# Patient Record
Sex: Male | Born: 1935 | Race: White | Hispanic: No | Marital: Married
Health system: Midwestern US, Academic
[De-identification: ages and names within clinical notes are randomized; demographics above are authoritative.]

---

## 2017-02-20 MED ORDER — ISOSORBIDE MONONITRATE 120 MG PO TB24
ORAL_TABLET | Freq: Every morning | ORAL | 1 refills | 90.00000 days | Status: DC
Start: 2017-02-20 — End: 2017-06-27

## 2017-03-19 ENCOUNTER — Encounter: Admit: 2017-03-19 | Discharge: 2017-03-19 | Payer: MEDICARE

## 2017-03-19 MED ORDER — AMLODIPINE 5 MG PO TAB
5 mg | ORAL_TABLET | Freq: Every day | ORAL | 2 refills | Status: AC
Start: 2017-03-19 — End: 2018-01-28

## 2017-05-22 ENCOUNTER — Encounter: Admit: 2017-05-22 | Discharge: 2017-05-22 | Payer: MEDICARE

## 2017-05-22 MED ORDER — ISOSORBIDE MONONITRATE 120 MG PO TB24
ORAL_TABLET | Freq: Every morning | ORAL | 2 refills | 30.00000 days | Status: AC
Start: 2017-05-22 — End: 2017-06-27

## 2017-06-27 ENCOUNTER — Ambulatory Visit: Admit: 2017-06-27 | Discharge: 2017-06-28 | Payer: MEDICARE

## 2017-06-27 ENCOUNTER — Encounter: Admit: 2017-06-27 | Discharge: 2017-06-27 | Payer: MEDICARE

## 2017-06-27 DIAGNOSIS — J449 Chronic obstructive pulmonary disease, unspecified: ICD-10-CM

## 2017-06-27 DIAGNOSIS — I25118 Atherosclerotic heart disease of native coronary artery with other forms of angina pectoris: Principal | ICD-10-CM

## 2017-06-27 DIAGNOSIS — J45909 Unspecified asthma, uncomplicated: ICD-10-CM

## 2017-06-27 DIAGNOSIS — I10 Essential (primary) hypertension: ICD-10-CM

## 2017-06-27 DIAGNOSIS — I252 Old myocardial infarction: ICD-10-CM

## 2017-06-27 DIAGNOSIS — I2510 Atherosclerotic heart disease of native coronary artery without angina pectoris: Principal | ICD-10-CM

## 2017-06-27 DIAGNOSIS — Z951 Presence of aortocoronary bypass graft: ICD-10-CM

## 2017-06-27 DIAGNOSIS — J189 Pneumonia, unspecified organism: ICD-10-CM

## 2017-06-27 DIAGNOSIS — J984 Other disorders of lung: ICD-10-CM

## 2017-06-27 NOTE — Progress Notes
Date of Service: 06/27/2017    Manuel Gould is a 82 y.o. male.       HPI     Thanks so much for letting me see Manuel Gould.  He has noted that since he has been on chemotherapy for a skin lesion that was biopsied on his nose, he has had some orthostatic lightheadedness.  I am going to back off on his Norvasc entirely over the weekend and let his blood pressure rise a bit while he talks with his cancer doctors about his medication.  He is not orthostatic in my office, but he notes if he bends over and stands up too quickly, he gets a bit lightheaded.     We have had recent in the past cardiac catheterizations that were satisfactory with patent grafts and a small circumflex with a 75% stenosis that we are treating medically.  He is on an ACE inhibitor and Imdur.  It may be that his blood pressure is lower on the chemotherapeutic agent, and we need to back off a bit.  Otherwise, he tells me he is completely asymptomatic.    ([id number])             Vitals:    06/27/17 1443 06/27/17 1455   BP: 116/52 122/56   Pulse: 61    Weight: 94.4 kg (208 lb 3.2 oz)    Height: 1.778 m (5' 10)      Body mass index is 29.87 kg/m???.     Past Medical History  Patient Active Problem List    Diagnosis Date Noted   ??? COPD (chronic obstructive pulmonary disease) (HCC)    ??? Ventricular hypertrophy 02/23/2009   ??? History of MI (myocardial infarction) 02/23/2009   ??? Asthma 02/23/2009   ??? 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 CAD (coronary artery disease), native coronary artery      A. June 2003 with LIMA graft to the LAD, a radial artery to the circumflex and a vein graft to the PDA.    B. Previous heart cath at [facility] with unsuccessful attempts to revascularize the artery, but there was no evidence of ischemia on perfusion exam in the right coronary artery territory.    C. Stress study from May 2005 showed an inferolateral perfusion defect that was not present on the more recent study      here in February 2010. D. Stress study from February 2010 which was performed with regadenoson stress with an ejection fraction of 62 percent had normal left ventricular volumes.  The perfusion images did not show ischemia.  There was evidence of some LAD injury, but no high risk indicators were noted.  E. 11/21/15 LHC: Patent grafts.     ??? Essential hypertension          Review of Systems   Constitution: Positive for decreased appetite.   HENT: Positive for tinnitus.    Eyes: Negative.    Cardiovascular: Negative.    Respiratory: Negative.    Endocrine: Negative.    Hematologic/Lymphatic: Negative.    Skin: Positive for skin cancer.   Musculoskeletal: Negative.    Gastrointestinal: Negative.    Genitourinary: Negative.    Neurological: Negative.    Psychiatric/Behavioral: Negative.    Allergic/Immunologic: Negative.        Physical Exam  Examination reveals an 82 year old gentleman in no acute distress.  His head and neck examination is benign.  His lung sounds are clear.  Heart  tones are regular.  He has a patch over a small area of his left lateral nasal crease.  Abdomen is nontender.  He has no peripheral edema, cyanosis, or clubbing.  Neurologically he is grossly intact.  Skin exam is benign.    ([id number])        Cardiovascular Studies  A 12-lead EKG today shows normal sinus rhythm with a right bundle branch block, which is an old finding.    ([id number])        Problems Addressed Today  No diagnosis found.    Assessment and Plan     In summary, this is an 82 year old gentleman with lightheadedness, orthostatic, on his antihypertensive therapy.  I am going to back off slightly while he is on his chemotherapy agent.  He is to call our office on Monday or Tuesday to report on his blood pressure diary.  If he is doing well with this adjustment, I will see him back in 3-6 months.    ([id number])             Current Medications (including today's revisions)  ??? amLODIPine (NORVASC) 5 mg tablet Take 1 tablet by mouth daily. ??? Aspirin 81 mg Tab Take 1 Tab by mouth Daily.   ??? Budesonide (PULMICORT FLEXHALER) 90 mcg/Inhalation AePB Inhale 1 Puff by mouth Twice Daily.   ??? ERIVEDGE 150 mg capsule Take 1 capsule by mouth daily.   ??? isosorbide mononitrate CR (IMDUR) 120 mg tablet TAKE 1 TAB BY MOUTH EVERY MORNING.   ??? metoprolol (LOPRESSOR) 50 mg tablet Take 25 mg by mouth twice daily.   ??? Ramipril (ALTACE) 5 mg Tab Take 1 Tab by mouth Daily.   ??? tamsulosin (FLOMAX) 0.4 mg capsule Take 0.4 mg by mouth Daily.

## 2017-07-09 ENCOUNTER — Encounter: Admit: 2017-07-09 | Discharge: 2017-07-09 | Payer: MEDICARE

## 2017-12-26 LAB — COMPREHENSIVE METABOLIC PANEL
Lab: 0.7 — ABNORMAL HIGH (ref 83–110)
Lab: 0.9
Lab: 101
Lab: 101
Lab: 101
Lab: 13
Lab: 133 — ABNORMAL HIGH (ref 83–110)
Lab: 139 — ABNORMAL LOW (ref 14.0–18.0)
Lab: 17
Lab: 29
Lab: 29 — ABNORMAL HIGH (ref 27.0–31.0)
Lab: 3.3 — ABNORMAL LOW (ref 3.4–4.8)
Lab: 3.5 — ABNORMAL LOW (ref 42.0–52.0)
Lab: 6.9
Lab: 9.5

## 2017-12-27 ENCOUNTER — Ambulatory Visit: Admit: 2017-12-27 | Discharge: 2017-12-28 | Payer: MEDICARE

## 2017-12-27 ENCOUNTER — Encounter: Admit: 2017-12-27 | Discharge: 2017-12-27 | Payer: MEDICARE

## 2017-12-27 DIAGNOSIS — R0602 Shortness of breath: Principal | ICD-10-CM

## 2017-12-28 LAB — CBC: Lab: 14 — ABNORMAL HIGH (ref 4.8–10.8)

## 2017-12-28 LAB — BASIC METABOLIC PANEL: Lab: 140 — ABNORMAL LOW (ref 4.70–6.10)

## 2018-01-14 ENCOUNTER — Ambulatory Visit: Admit: 2018-01-14 | Discharge: 2018-01-15 | Payer: MEDICARE

## 2018-01-14 ENCOUNTER — Encounter: Admit: 2018-01-14 | Discharge: 2018-01-14 | Payer: MEDICARE

## 2018-01-14 DIAGNOSIS — I25118 Atherosclerotic heart disease of native coronary artery with other forms of angina pectoris: Principal | ICD-10-CM

## 2018-01-14 DIAGNOSIS — J45909 Unspecified asthma, uncomplicated: ICD-10-CM

## 2018-01-14 DIAGNOSIS — Z951 Presence of aortocoronary bypass graft: ICD-10-CM

## 2018-01-14 DIAGNOSIS — J984 Other disorders of lung: ICD-10-CM

## 2018-01-14 DIAGNOSIS — J189 Pneumonia, unspecified organism: ICD-10-CM

## 2018-01-14 DIAGNOSIS — I252 Old myocardial infarction: ICD-10-CM

## 2018-01-14 DIAGNOSIS — I10 Essential (primary) hypertension: ICD-10-CM

## 2018-01-14 DIAGNOSIS — J449 Chronic obstructive pulmonary disease, unspecified: ICD-10-CM

## 2018-01-14 DIAGNOSIS — I2510 Atherosclerotic heart disease of native coronary artery without angina pectoris: Principal | ICD-10-CM

## 2018-01-16 ENCOUNTER — Encounter: Admit: 2018-01-16 | Discharge: 2018-01-16 | Payer: MEDICARE

## 2018-01-16 DIAGNOSIS — J45909 Unspecified asthma, uncomplicated: ICD-10-CM

## 2018-01-16 DIAGNOSIS — J449 Chronic obstructive pulmonary disease, unspecified: ICD-10-CM

## 2018-01-16 DIAGNOSIS — J189 Pneumonia, unspecified organism: ICD-10-CM

## 2018-01-16 DIAGNOSIS — I252 Old myocardial infarction: ICD-10-CM

## 2018-01-16 DIAGNOSIS — I10 Essential (primary) hypertension: Secondary | ICD-10-CM

## 2018-01-16 DIAGNOSIS — Z951 Presence of aortocoronary bypass graft: ICD-10-CM

## 2018-01-16 DIAGNOSIS — I2510 Atherosclerotic heart disease of native coronary artery without angina pectoris: Principal | ICD-10-CM

## 2018-01-16 DIAGNOSIS — J984 Other disorders of lung: ICD-10-CM

## 2018-01-28 ENCOUNTER — Encounter: Admit: 2018-01-28 | Discharge: 2018-01-28 | Payer: MEDICARE

## 2018-04-01 LAB — BASIC METABOLIC PANEL
Lab: 14
Lab: 140

## 2018-04-01 LAB — LIPID PROFILE
Lab: 107 — ABNORMAL HIGH (ref <100)
Lab: 15 — ABNORMAL HIGH (ref 83–110)
Lab: 163
Lab: 4

## 2018-04-01 LAB — HEMOGLOBIN A1C: Lab: 5.5

## 2018-04-01 LAB — THYROID STIMULATING HORMONE-TSH: Lab: 1.1 — ABNORMAL HIGH (ref 27.0–31.0)

## 2018-04-01 LAB — PROSTATIC SPECIFIC ANTIGEN-PSA: Lab: 0.8 — ABNORMAL HIGH (ref 80.0–99.0)

## 2018-04-01 LAB — CBC: Lab: 7.4

## 2018-07-01 ENCOUNTER — Encounter: Admit: 2018-07-01 | Discharge: 2018-07-01 | Payer: MEDICARE

## 2018-07-01 MED ORDER — ISOSORBIDE MONONITRATE 120 MG PO TB24
120 mg | ORAL_TABLET | Freq: Every morning | ORAL | 2 refills | 30.00000 days | Status: AC
Start: 2018-07-01 — End: 2019-05-05

## 2018-12-10 LAB — BASIC METABOLIC PANEL
Lab: 0.9
Lab: 10
Lab: 102
Lab: 13
Lab: 138
Lab: 161 — ABNORMAL HIGH (ref 70–105)
Lab: 28
Lab: 4.5
Lab: 82
Lab: 9.9

## 2018-12-31 ENCOUNTER — Encounter: Admit: 2018-12-31 | Discharge: 2018-12-31 | Payer: MEDICARE

## 2019-01-01 ENCOUNTER — Encounter: Admit: 2019-01-01 | Discharge: 2019-01-01 | Payer: MEDICARE

## 2019-01-01 ENCOUNTER — Ambulatory Visit: Admit: 2019-01-01 | Discharge: 2019-01-02 | Payer: MEDICARE

## 2019-01-01 DIAGNOSIS — J984 Other disorders of lung: ICD-10-CM

## 2019-01-01 DIAGNOSIS — I10 Essential (primary) hypertension: ICD-10-CM

## 2019-01-01 DIAGNOSIS — I2510 Atherosclerotic heart disease of native coronary artery without angina pectoris: Principal | ICD-10-CM

## 2019-01-01 DIAGNOSIS — I252 Old myocardial infarction: ICD-10-CM

## 2019-01-01 DIAGNOSIS — Z951 Presence of aortocoronary bypass graft: ICD-10-CM

## 2019-01-01 DIAGNOSIS — J45909 Unspecified asthma, uncomplicated: ICD-10-CM

## 2019-01-01 DIAGNOSIS — J189 Pneumonia, unspecified organism: ICD-10-CM

## 2019-01-01 DIAGNOSIS — I25118 Atherosclerotic heart disease of native coronary artery with other forms of angina pectoris: Principal | ICD-10-CM

## 2019-01-01 DIAGNOSIS — J449 Chronic obstructive pulmonary disease, unspecified: ICD-10-CM

## 2019-01-01 NOTE — Progress Notes
recently.  He does not really know why.  I am still quite concerned by what sounds like classic angina.  He has had previous bypass surgery.  I am going to set him up for a regadenoson myocardial perfusion study as soon as feasible.    ([id number])             Current Medications (including today's revisions)  ??? Aspirin 81 mg Tab Take 1 Tab by mouth Daily.   ??? budesonide respule (PULMICORT) 0.5 mg/2 mL nebulizer solution Inhale 2 mL solution by nebulizer as directed twice daily.   ??? ERIVEDGE 150 mg capsule Take 1 capsule by mouth daily.   ??? finasteride (PROSCAR) 5 mg tablet Take 5 mg by mouth daily.   ??? isosorbide mononitrate CR (IMDUR) 120 mg tablet Take one tablet by mouth every morning.   ??? metoprolol (LOPRESSOR) 50 mg tablet Take 25 mg by mouth twice daily.

## 2019-01-08 ENCOUNTER — Encounter: Admit: 2019-01-08 | Discharge: 2019-01-08 | Payer: MEDICARE

## 2019-01-08 ENCOUNTER — Ambulatory Visit: Admit: 2019-01-08 | Discharge: 2019-01-09 | Payer: MEDICARE

## 2019-01-08 DIAGNOSIS — I25118 Atherosclerotic heart disease of native coronary artery with other forms of angina pectoris: Principal | ICD-10-CM

## 2019-01-12 ENCOUNTER — Encounter: Admit: 2019-01-12 | Discharge: 2019-01-12 | Payer: MEDICARE

## 2019-01-22 ENCOUNTER — Encounter: Admit: 2019-01-22 | Discharge: 2019-01-22 | Payer: MEDICARE

## 2019-01-22 ENCOUNTER — Encounter: Admit: 2019-01-22 | Discharge: 2019-01-23 | Payer: MEDICARE

## 2019-01-23 ENCOUNTER — Inpatient Hospital Stay
Admit: 2019-01-23 | Discharge: 2019-01-25 | Disposition: A | Discharge status: Home / Self Care | Payer: MEDICARE | Source: Other Acute Inpatient Hospital | Attending: Cardiovascular Disease | Admitting: Cardiovascular Disease

## 2019-01-23 ENCOUNTER — Encounter: Admit: 2019-01-23 | Discharge: 2019-01-23 | Payer: MEDICARE

## 2019-01-23 LAB — BASIC METABOLIC PANEL
Lab: 0.7 mg/dL (ref 0.4–1.24)
Lab: 13 mg/dL (ref 7–25)
Lab: 29 MMOL/L (ref 21–30)
Lab: >60 mL/min (ref >60)

## 2019-01-23 LAB — TROPONIN-I
Lab: 1.1 ng/mL — ABNORMAL HIGH (ref 0.0–0.05)
Lab: 1.2 ng/mL — ABNORMAL HIGH (ref 0.0–0.05)

## 2019-01-23 LAB — CBC AND DIFF
Lab: 0 10*3/uL (ref 0–0.45)
Lab: 0.1 10*3/uL (ref 0–0.20)
Lab: 10 10*3/uL (ref 4.5–11.0)

## 2019-01-23 LAB — MAGNESIUM: Lab: 2 mg/dL (ref 1.6–2.6)

## 2019-01-23 LAB — POTASSIUM: Lab: 5.1 MMOL/L (ref 3.5–5.1)

## 2019-01-23 LAB — PTT (APTT)
Lab: 36 s (ref 24.0–36.5)
Lab: 54 s — ABNORMAL HIGH (ref 24.0–36.5)

## 2019-01-23 LAB — BNP (B-TYPE NATRIURETIC PEPTI): Lab: 277 pg/mL — ABNORMAL HIGH (ref 0–100)

## 2019-01-23 MED ORDER — HEPARIN (PORCINE) IN 5 % DEX 20,000 UNIT/500 ML (40 UNIT/ML) IV SOLP
0-2000 [IU]/h | INTRAVENOUS | 0 refills | Status: DC
Start: 2019-01-23 — End: 2019-01-24
  Administered 2019-01-23: 08:00:00 1000 [IU]/h via INTRAVENOUS

## 2019-01-23 MED ORDER — BUDESONIDE 0.5 MG/2 ML IN NBSP
0.5 mg | Freq: Two times a day (BID) | RESPIRATORY_TRACT | 0 refills | Status: DC
Start: 2019-01-23 — End: 2019-01-25
  Administered 2019-01-23 – 2019-01-25 (×4): 0.5 mg via RESPIRATORY_TRACT

## 2019-01-23 MED ORDER — ASPIRIN 325 MG PO TAB
325 mg | Freq: Once | ORAL | 0 refills | Status: CP
Start: 2019-01-23 — End: ?
  Administered 2019-01-23: 07:00:00 325 mg via ORAL

## 2019-01-23 MED ORDER — SODIUM CHLORIDE 0.9 % IV SOLP
500 mL | Freq: Once | INTRAVENOUS | 0 refills | Status: CP | PRN
Start: 2019-01-23 — End: ?
  Administered 2019-01-23: 15:00:00 500 mL via INTRAVENOUS

## 2019-01-23 MED ORDER — ASPIRIN 81 MG PO TBEC
81 mg | Freq: Every day | ORAL | 0 refills | Status: DC
Start: 2019-01-23 — End: 2019-01-25
  Administered 2019-01-24 – 2019-01-25 (×2): 81 mg via ORAL

## 2019-01-23 MED ORDER — ALBUTEROL SULFATE 90 MCG/ACTUATION IN HFAA
2 | RESPIRATORY_TRACT | 0 refills | Status: DC | PRN
Start: 2019-01-23 — End: 2019-01-25

## 2019-01-23 MED ORDER — METOPROLOL TARTRATE 25 MG PO TAB
25 mg | Freq: Once | ORAL | 0 refills | Status: CP
Start: 2019-01-23 — End: ?
  Administered 2019-01-23: 23:00:00 25 mg via ORAL

## 2019-01-23 MED ORDER — HEPARIN (PORCINE) BOLUS FOR CONTINUOUS INF (BAG)
20-40 [IU]/kg | INTRAVENOUS | 0 refills | Status: DC
Start: 2019-01-23 — End: 2019-01-24

## 2019-01-23 MED ORDER — ISOSORBIDE MONONITRATE 60 MG PO TB24
120 mg | Freq: Every day | ORAL | 0 refills | Status: DC
Start: 2019-01-23 — End: 2019-01-25
  Administered 2019-01-23 – 2019-01-25 (×3): 120 mg via ORAL

## 2019-01-23 MED ORDER — TICAGRELOR 90 MG PO TAB
90 mg | Freq: Two times a day (BID) | ORAL | 0 refills | Status: AC
Start: 2019-01-23 — End: ?
  Administered 2019-01-24 – 2019-01-25 (×2): 90 mg via ORAL

## 2019-01-23 MED ORDER — DIPHENHYDRAMINE HCL 25 MG PO CAP
25 mg | ORAL | 0 refills | Status: DC | PRN
Start: 2019-01-23 — End: 2019-01-25

## 2019-01-23 MED ORDER — POTASSIUM CHLORIDE 20 MEQ PO TBTQ
40 meq | Freq: Once | ORAL | 0 refills | Status: CP
Start: 2019-01-23 — End: ?
  Administered 2019-01-23: 10:00:00 40 meq via ORAL

## 2019-01-23 MED ORDER — FINASTERIDE 5 MG PO TAB
5 mg | Freq: Every day | ORAL | 0 refills | Status: DC
Start: 2019-01-23 — End: 2019-01-25
  Administered 2019-01-24 – 2019-01-25 (×2): 5 mg via ORAL

## 2019-01-23 MED ORDER — METOPROLOL TARTRATE 25 MG PO TAB
25 mg | Freq: Two times a day (BID) | ORAL | 0 refills | Status: DC
Start: 2019-01-23 — End: 2019-01-25
  Administered 2019-01-24 – 2019-01-25 (×3): 25 mg via ORAL

## 2019-01-23 MED ORDER — PERFLUTREN LIPID MICROSPHERES 1.1 MG/ML IV SUSP
1-20 mL | Freq: Once | INTRAVENOUS | 0 refills | Status: CP | PRN
Start: 2019-01-23 — End: ?
  Administered 2019-01-23: 22:00:00 6 mL via INTRAVENOUS

## 2019-01-23 MED ORDER — ISOSORBIDE MONONITRATE 60 MG PO TB24
120 mg | Freq: Every day | ORAL | 0 refills | Status: DC
Start: 2019-01-23 — End: 2019-01-23

## 2019-01-23 MED ORDER — DIPHENHYDRAMINE HCL 50 MG/ML IJ SOLN
25 mg | INTRAVENOUS | 0 refills | Status: DC | PRN
Start: 2019-01-23 — End: 2019-01-25

## 2019-01-23 NOTE — H&P (View-Only)
Chief Complaint:  Chest pain  History of Present Illness: Manuel Gould is an 84 y/o male with past medical history significant for: COPD, CAD s/p CABG 2003 and HTN who was transferred to [facility] for management of NSTEMI    Patient has been following with Dr. Lyda Kalata for typical anginal chest pain for the preceding 2 weeks.  His typical pain is described as substernal burning pain with radiation down his left arm is relieved with rest and exacerbated by exercise.  Due to the nature of the pain Dr. Lawerance Sabal had proceeded with stress testing which was minimally abnormal.  Since the stress test he has continued to have similar pain episodes.  Tonight, he developed the same chest pain while sitting watching TV without any exertion.  The chest pain radiated down his left arm and pain was rated a 4 out of 10 in severity.  Due to the pain occurring at rest which is different from his typical anginal pain, he presented to the ER.  By time of arrival to ER his chest pain is subsided.  At outside hospital ER, troponin was noted to be elevated to 1.6.  Patient was given aspirin and heparin bolus before transfer to [facility].    On arrival, patient voices no chest pain.  He denies any shortness of breath or palpitations.     Medical History:   Diagnosis Date   ??? Asthma    ??? CAD (coronary artery disease)    ??? CAD (coronary artery disease), native coronary artery     A. June 2003 with LIMA graft to the LAD, a radial artery to the circumflex and a vein graft to the PDA.   B. Previous heart cath at [facility] with unsuccessful attempts to revascularize the artery, but there was no evidence of ischemia on perfusion exam in the right coronary artery territory.   C. Stress study from May 2005 showed an inferolateral perfusion defect that was not present on    ??? Chronic lung disease 02/16/2010   ??? COPD (chronic obstructive pulmonary disease) (HCC)    ??? Essential hypertension ??? History of MI (myocardial infarction) 02/23/2009   ??? HTN (hypertension)    ??? Pneumonia 04/2009    hospitalized   ??? 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 HX CORONARY ARTERY BYPASS GRAFT  2003    x3 vessels   ??? Left Heart Catheterization With Ventriculogram Left 11/21/2015    Performed by Greig Castilla[facility] at [facility] CATH LAB   ??? Coronary Angiography N/A 11/21/2015    Performed by Greig Castilla[facility] at [facility] CATH LAB   ??? Possible Percutaneous Coronary Intervention N/A 11/21/2015    Performed by Greig Castilla, MD at [facility] CATH LAB     Family history reviewed; non-contributory  Social History     Socioeconomic History   ??? Marital status: Married     Spouse name: Not on file   ??? Number of children: Not on file   ??? Years of education: Not on file   ??? Highest education level: Not on file   Occupational History   ??? Not on file   Social Needs   ??? Financial resource strain: Not on file   ??? Food insecurity     Worry: Not on file     Inability: Not on file   ??? Transportation needs     Medical:  Not on file     Non-medical: Not on file   Tobacco Use   ??? Smoking status: Former Smoker     Last attempt to quit: 10/15/1958     Years since quitting: 60.3   ??? Smokeless tobacco: Never Used   Substance and Sexual Activity   ??? Alcohol use: No   ??? Drug use: No   ??? Sexual activity: Not on file   Lifestyle   ??? Physical activity     Days per week: Not on file     Minutes per session: Not on file   ??? Stress: Not on file   Relationships   ??? Social Wellsite geologist on phone: Not on file     Gets together: Not on file     Attends religious service: Not on file     Active member of club or organization: Not on file     Attends meetings of clubs or organizations: Not on file     Relationship status: Not on file   ??? Intimate partner violence     Fear of current or ex partner: Not on file     Emotionally abused: Not on file     Physically abused: Not on file     Forced sexual activity: Not on file GI: Soft, non-distended. No tenderness to palpation. Bowel sounds active   Ext: no lower extremity edema. Left Radial pulse absent  Psych: Appropriate mood and affect.      Lab/Radiology/Other Diagnostic Tests:  24-hour labs:  No results found for this visit on 01/23/19 (from the past 24 hour(s)).     Pertinent radiology reviewed.    Ann Maki, MD  Pager [phone]

## 2019-01-23 NOTE — Case Management (ED)
Case Management Admission Assessment    NAME:Manuel Gould                          MRN: [id number]             DOB:March 23, 1935          AGE: 84 y.o.  ADMISSION DATE: 01/23/2019             DAYS ADMITTED: LOS: 0 days      Today???s Date: 01/23/2019    Source of Information: Pt's spouse       Plan  ??? Plan: Case Management Assessment, Discharge Planning for Home Anticipated, Assist PRN with SW/NCM Services  ??? NCM spoke with pt's spouse via telephone due to COVID-19 precautions in continuation of care and dc planning. NCM introduced self and NCM/SW roles. Provided contact information and encouraged pt to reach out to [facility] team with questions or concerns.   ??? Pt's Cardiologist is Dr. Geronimo Boot w [facility]  ??? Pt admitted for further eval/treat of NSTEMI.  At this time, anticipate pt will dc home w family assist,  ??? CM team to continue to follow for further dc planning/case progression.     Patient Address/Phone  [address]  [place] [address]  [phone] (home)     Emergency Contact  Extended Emergency Contact Information  Primary Emergency Contact: Nooney,Carol  Address: [address]           [place] [address]  Home Phone: [phone]  Mobile Phone: [phone]  Relation: Spouse  Secondary Emergency Contact: Wobletz,[name]  Address: [address]           [place] [address] [place]  Home Phone: [phone]  Relation: Daughter    Healthcare Directive         Transportation  Does the patient need discharge transport arranged?: No  Transportation Name, Phone and Availability #1: [name], pt's dgt  Does the patient use Medicaid Transportation?: No    Expected Discharge Date  Expected Discharge Date: 01/25/19  Expected Discharge Time: 1200    Living Situation Prior to Admission  ? Living Arrangements  Type of Residence: Home, independent  Living Arrangements: Spouse/significant other  Financial risk analyst / Tub: Tub/Shower Unit  How many levels in the residence?: 2  Can patient live on one level if needed?: Yes Does residence have entry and/or side stairs?: Yes(4- managed steps well pta)  Assistance needed prior to admit or anticipated on discharge: Yes  Who provides assistance or could if needed?: Spouse is herself receovering from surgery but still independent and able to assist pt w non-physical needs. They also have their dgt [name] who lives close and can assist as well.  Are they in good health?: Yes  Can support system provide 24/7 care if needed?: Maybe  ? Level of Function   Prior level of function: Independent  ? Cognitive Abilities   Cognitive Abilities: (Assessment completed w spouse.)    Financial Resources  ? Coverage  Primary Insurance: Medicare(A/B)  Secondary Insurance: Best boy)  Additional Coverage: (S) RX(PDP- BCBS- meds affordable thus far)    ? Source of Income   Source Of Income: Other retirement income  ? Financial Assistance Needed?  NA    Psychosocial Needs  ? Mental Health  Mental Health History: No  ? Substance Use History  Substance Use History Screen: No  ? Other  NA    Current/Previous Services  ? PCP  [place],  Jonny Ruiz, [phone], [phone]  ? Pharmacy    [facility] - [place], [place] - [address]  [address] [place] [address]  Phone: [phone] Fax: [phone]    [id number] - [place], [place] - [address]  [address] [place]  [place] [address]  Phone: [phone] Fax: [phone]    ? Durable Medical Equipment   Durable Medical Equipment at home: Nebulizer(uses twice a day)  ? Home Health  Receiving home health: No  ? Hemodialysis or Peritoneal Dialysis  Undergoing hemodialysis or peritoneal dialysis: No  ? Tube/Enteral Feeds     ? Infusion  Receive infusions: No  ? Private Duty  Private duty help used: No  ? Home and Community Based Services  Home and community based services: No  ? [employer]: N/A  ? Hospice  Hospice: No  ? Outpatient Therapy  PT: No  OT: No  SLP: No  ? Skilled Nursing Facility/Nursing Home

## 2019-01-23 NOTE — Progress Notes
Cardiovascular Inpatient Progress Note    Name:  Manuel Gould   NFAOZ'H Date:  01/23/2019  Admission Date: 01/23/2019  LOS: LOS: 0 days                     Assessment/Plan:    Principal Problem:    NSTEMI (non-ST elevated myocardial infarction) [facility])  Active Problems:    CAD (coronary artery disease), native coronary artery    Essential hypertension    S/P CABG x 3    COPD (chronic obstructive pulmonary disease) (HCC)    History of basal cell carcinoma    BPH (benign prostatic hyperplasia)    Manuel Gould is an 84 y/o male with past medical history significant for: COPD, CAD s/p CABG 2003 and HTN who was transferred to [facility] for management of NSTEMI    NSTEMI  CAD  - 20 minute episode of CP occurring at rest, troponin elevated at OSH ED to 1.68. Heparin bolus and aspirin loaded prior to transfer.  - Previous Hx of CABG 2003: LIMA to LAD, Radial to Circ, Vein graft to PDA  - Last LHC 11/21/15: Patency of grafts  - Reg MPI 01/08/19: abnormal, demonstrating small???sized basal inferior and inferoseptal???perfusion defect. LVEF 60%  - EKG on arrival: no ST abnormality, persistent RBBB present on prior EKGs noted  - Overall, patient with significant concern for progression of underlying coronary disease given history of progression from Typical Anginal symptoms towards Unstable Angina now with NSTEMI. TIMI score 5.   - Troponin 1.2 -> 1.25  - BNP 277  Plan:  > Continue heparin gtt  > LHC this AM  > n.p.o. at midnight  > Trend troponin q4h  > aspirin 81 mg daily  > Lopressor 25 mg twice daily and Imdur 120 mg daily  > No statin due to history of intolerance  ???  HTN  > Holding ramipril 5 mg for left heart cath  ???  Asthma/COPD  - Patient worked at [employer] with significant environmental exposure  > Pulmicort Neb BID  ???  BPH  - Continue Proscar 5mg  QD  ???  Basal Cell Carcinoma  - Prior surgical removal to nose with reconstructive surgery, healing well w/o evidence of infection.

## 2019-01-24 LAB — LIPID PROFILE
Lab: 114 mg/dL
Lab: 154 mg/dL (ref <200)
Lab: 17 mg/dL
Lab: 87 mg/dL (ref <150)
Lab: 98 mg/dL (ref <100)

## 2019-01-24 LAB — HEMOGLOBIN A1C: Lab: 5.9 % — ABNORMAL HIGH (ref 4.0–6.0)

## 2019-01-24 MED ORDER — CLOPIDOGREL 75 MG PO TAB
75 mg | Freq: Every day | ORAL | 0 refills | Status: DC
Start: 2019-01-24 — End: 2019-01-25

## 2019-01-24 MED ORDER — EZETIMIBE 10 MG PO TAB
10 mg | Freq: Every day | ORAL | 0 refills | Status: DC
Start: 2019-01-24 — End: 2019-01-25
  Administered 2019-01-24 – 2019-01-25 (×2): 10 mg via ORAL

## 2019-01-24 MED ORDER — RAMIPRIL 5 MG PO CAP
5 mg | Freq: Every day | ORAL | 0 refills | Status: DC
Start: 2019-01-24 — End: 2019-01-25
  Administered 2019-01-24 – 2019-01-25 (×2): 5 mg via ORAL

## 2019-01-24 MED ORDER — ENOXAPARIN 40 MG/0.4 ML SC SYRG
40 mg | Freq: Every day | SUBCUTANEOUS | 0 refills | Status: DC
Start: 2019-01-24 — End: 2019-01-25
  Administered 2019-01-25: 01:00:00 40 mg via SUBCUTANEOUS

## 2019-01-24 MED ORDER — CLOPIDOGREL 300 MG PO TAB
300 mg | Freq: Every day | ORAL | 0 refills | Status: CP
Start: 2019-01-24 — End: ?
  Administered 2019-01-25: 13:00:00 300 mg via ORAL

## 2019-01-24 MED ORDER — TICAGRELOR 90 MG PO TAB
90 mg | ORAL_TABLET | Freq: Two times a day (BID) | ORAL | 0 refills | Status: CN
Start: 2019-01-24 — End: ?

## 2019-01-24 NOTE — Case Management (ED)
Case Management Progress Note    NAME:Manuel Gould                          MRN: [id number]              DOB:Nov 15, 1934          AGE: 84 y.o.  ADMISSION DATE: 01/23/2019             DAYS ADMITTED: LOS: 1 day      Today???s Date: 01/24/2019    Plan:  On-going    Interventions  ? Support      ? Info or Referral      ? Discharge Planning      ? Medication Needs   Medication Needs: Co-Pay Check    - This On-Call NCM received call from Dr. Malachy Chamber regarding pt's coverage for Brilinta 90mg  PO BID 60 tabs / 30 days.   - Pt has Medicare - There are no copay cards for Brilinta available for Medicare pts.   - Confirmed with [facility]'s [facility], in [place], [place], that pt has [employer] (Phone:  [phone] / ID:  [id number] / Group:  [id number] / PCN: [id number] / BIN: [id number]).  Contacted payor and s/w Rosey Bath.  Rosey Bath unable to give this NCM coverage information.  Transferred this NCM to [facility] in Customer Service [phone]).  Shawn reports that pt's estimated copay for Brilinta 90mg  PO BID 60 tabs / 30 days is $159.41 / month, no prior auth needed.   - Updated Dr. Malachy Chamber.  Alternative options could be Prasugrel 10mg  daily or Plavix 75mg  daily.   [employer] Customer Service [phone]) back to obtain coverage information on the above.  Spoke with [place] who provided the following estimated copays:    - Prasugrel 10mg  daily 30 tabs / 30 days = $40.00, no Prior Auth needed    - Clopidogrel (brand name of Plavix is not on preferred drug list) 75mg  daily 30 tabs / 30 days =  $3.00    - Updated Dr. Malachy Chamber on the above.                                             ? Financial      ? Legal      ? Other        Disposition  ? Expected Discharge Date    Expected Discharge Date: 01/24/19  Expected Discharge Time: 1200  ? Transportation   Does the patient need discharge transport arranged?: No  Transportation Name, Phone and Availability #1: [name], pt's dgt  Does the patient use Medicaid Transportation?: No

## 2019-01-24 NOTE — Care Plan
Problem: Discharge Planning  Goal: Participation in plan of care  Outcome: Goal Ongoing  Goal: Knowledge regarding plan of care  Outcome: Goal Ongoing  Goal: Prepared for discharge  Outcome: Goal Ongoing  Reviewed plan of care with pt. Pt has no questions at this time. Will continue to update as plan progresses.  D/C planned for 4/11; s/p LHC, VSS

## 2019-01-24 NOTE — Progress Notes
Cardiovascular Inpatient Progress Note    Name:  Manuel Gould   VHQIO'N Date:  01/24/2019  Admission Date: 01/23/2019  LOS: LOS: 1 day                     Assessment/Plan:    Principal Problem:    NSTEMI (non-ST elevated myocardial infarction) (HCC)  Active Problems:    CAD (coronary artery disease), native coronary artery    Essential hypertension    S/P CABG x 3    COPD (chronic obstructive pulmonary disease) (HCC)    History of basal cell carcinoma    BPH (benign prostatic hyperplasia)    Manuel Gould is an 84 y/o male with past medical history significant for: COPD, CAD s/p CABG 2003 and HTN who was transferred to [facility] for management of NSTEMI    NSTEMI  CAD  - 20 minute episode of CP occurring at rest, troponin elevated at OSH ED to 1.68. Heparin bolus and aspirin loaded prior to transfer.  - Previous Hx of CABG 2003: LIMA to LAD, Radial to Circ, Vein graft to PDA  - Last LHC 11/21/15: Patency of grafts  - Reg MPI 01/08/19: abnormal, demonstrating small???sized basal inferior and inferoseptal???perfusion defect. LVEF 60%  - EKG on arrival: no ST abnormality, persistent RBBB present on prior EKGs noted  - Overall, patient with significant concern for progression of underlying coronary disease given history of progression from Typical Anginal symptoms towards Unstable Angina now with NSTEMI. TIMI score 5.   - Troponin 1.2 -> 1.25, BNP 277  - 4/10 Cath: drug-eluting stents to SVG-RPDA proximal and distal; diffuse severe multivessel disease  - 4/10 Echo: LVEF 55%, limited exam 2/2 technically difficult study  - Lipid panel: Chol 154, TG 87, HDL 40, LDL 98, VLDL 17, non HDL 114  Plan:  > D/c'd heparin gtt  > aspirin 81 mg daily add ticagrelor 90mg  BID   - Price check Brilinta   > Lopressor 25 mg twice daily and Imdur 120 mg daily  > No statin due to history of intolerance  > Add Zetia   ???  HTN  > Restart PTA 5mg  ramipril previously held for [facility]  > Consider adding amlodipine  ???  Asthma/COPD - Patient worked at [employer] with significant environmental exposure  > Pulmicort Neb BID  ???  BPH  - Continue Proscar 5mg  QD  ???  Basal Cell Carcinoma  - Prior surgical removal to nose with reconstructive surgery, healing well w/o evidence of infection.    FEN: IVF per Protocol - Monitor lytes & replace to keep K> 4 & Mg >2 - Advance Diet as Tolerated  DIET CARDIAC(LOW FAT/LOW SODIUM)  DVT PPx: Heparin gtt  Code: Full Code    Disposition: Continue admission to [facility], likely d/c home tomorrow  PT/OT: Ordered    Patient seen & discussed with Dr. Hinton Dyer Manuel Harnack, DO  Emergency Medicine, PGY2  Available on [employer]  ________________________________________________________________________    Subjective:  Manuel Gould is a 84 y.o. male.  No acute events overnight.  Patient denies any chest pain or difficulty breathing since evaluated yesterday. Patient denies groin pain, RLE numbness/tingling/weakness.    Review of Systems:  In addition to those stated in subjective patient denies any fevers, chills, chest pain, shortness of breath, N/V/D/C, or leg swelling.    Medications  Scheduled Meds:aspirin EC tablet 81 mg, 81 mg, Oral, QDAY  budesonide respule (PULMICORT) nebulizer solution  0.5 mg, 0.5 mg, Inhalation, BID  enoxaparin (LOVENOX) syringe 40 mg, 40 mg, Subcutaneous, QDAY(21)  finasteride (PROSCAR) tablet 5 mg, 5 mg, Oral, QDAY  isosorbide mononitrate SR (IMDUR) tablet 120 mg, 120 mg, Oral, QDAY  metoprolol tartrate (LOPRESSOR) tablet 25 mg, 25 mg, Oral, BID  ticagrelor (BRILINTA) tablet 90 mg, 90 mg, Oral, BID    Continuous Infusions:    PRN and Respiratory Meds:albuterol sulfate Q4H PRN, diphenhydrAMINE Q4H PRN **OR** diphenhydrAMINE Q4H PRN      Objective:                        Vital Signs: Last Filed                 Vital Signs: 24 Hour Range   BP: 143/57 (04/11 0826)  Temp: 36.9 ???C (98.4 ???F) (04/11 1610)  Pulse: 65 (04/11 0826)  Respirations: 18 PER MINUTE (04/11 0826)

## 2019-01-25 ENCOUNTER — Encounter: Admit: 2019-01-25 | Discharge: 2019-01-25 | Payer: MEDICARE

## 2019-01-25 ENCOUNTER — Inpatient Hospital Stay: Admit: 2019-01-23 | Discharge: 2019-01-23 | Payer: MEDICARE

## 2019-01-25 DIAGNOSIS — Z87891 Personal history of nicotine dependence: ICD-10-CM

## 2019-01-25 DIAGNOSIS — I25110 Atherosclerotic heart disease of native coronary artery with unstable angina pectoris: ICD-10-CM

## 2019-01-25 DIAGNOSIS — Z79899 Other long term (current) drug therapy: ICD-10-CM

## 2019-01-25 DIAGNOSIS — J449 Chronic obstructive pulmonary disease, unspecified: ICD-10-CM

## 2019-01-25 DIAGNOSIS — N400 Enlarged prostate without lower urinary tract symptoms: ICD-10-CM

## 2019-01-25 DIAGNOSIS — E785 Hyperlipidemia, unspecified: ICD-10-CM

## 2019-01-25 DIAGNOSIS — C4491 Basal cell carcinoma of skin, unspecified: ICD-10-CM

## 2019-01-25 DIAGNOSIS — T82858A Stenosis of vascular prosthetic devices, implants and grafts, initial encounter: Principal | ICD-10-CM

## 2019-01-25 DIAGNOSIS — I10 Essential (primary) hypertension: ICD-10-CM

## 2019-01-25 DIAGNOSIS — Z951 Presence of aortocoronary bypass graft: ICD-10-CM

## 2019-01-25 DIAGNOSIS — Z85828 Personal history of other malignant neoplasm of skin: ICD-10-CM

## 2019-01-25 DIAGNOSIS — Z7982 Long term (current) use of aspirin: ICD-10-CM

## 2019-01-25 DIAGNOSIS — I214 Non-ST elevation (NSTEMI) myocardial infarction: ICD-10-CM

## 2019-01-25 DIAGNOSIS — I252 Old myocardial infarction: ICD-10-CM

## 2019-01-25 LAB — BASIC METABOLIC PANEL: Lab: 139 MMOL/L (ref 137–147)

## 2019-01-25 MED ORDER — POTASSIUM CHLORIDE 20 MEQ PO TBTQ
20 meq | Freq: Once | ORAL | 0 refills | Status: CP
Start: 2019-01-25 — End: ?
  Administered 2019-01-25: 12:00:00 20 meq via ORAL

## 2019-01-25 MED ORDER — RAMIPRIL 5 MG PO CAP
5 mg | ORAL_CAPSULE | Freq: Every day | ORAL | 0 refills | Status: DC
Start: 2019-01-25 — End: 2019-03-17
  Filled 2019-01-25 (×2): qty 90, 90d supply, fill #1

## 2019-01-25 MED ORDER — MAGNESIUM SULFATE IN D5W 1 GRAM/100 ML IV PGBK
1 g | Freq: Once | INTRAVENOUS | 0 refills | Status: CP
Start: 2019-01-25 — End: ?
  Administered 2019-01-25: 12:00:00 1 g via INTRAVENOUS

## 2019-01-25 MED ORDER — NITROGLYCERIN 0.4 MG SL SUBL
.4 mg | ORAL_TABLET | SUBLINGUAL | 3 refills | 9.00000 days | Status: AC | PRN
Start: 2019-01-25 — End: ?
  Filled 2019-01-25 (×2): qty 25, 9d supply, fill #1

## 2019-01-25 MED ORDER — CLOPIDOGREL 75 MG PO TAB
75 mg | ORAL_TABLET | Freq: Every day | ORAL | 3 refills | 90.00000 days | Status: DC
Start: 2019-01-25 — End: 2020-02-16
  Filled 2019-01-25 (×2): qty 90, 90d supply, fill #1

## 2019-01-25 MED ORDER — EZETIMIBE 10 MG PO TAB
10 mg | ORAL_TABLET | Freq: Every day | ORAL | 0 refills | Status: DC
Start: 2019-01-25 — End: 2019-09-22
  Filled 2019-01-25 (×2): qty 90, 30d supply, fill #1

## 2019-01-25 NOTE — Progress Notes
On 01/25/2019, Manuel Gould was counseled and provided with a list of his discharge medications as part of his After Visit Summary.  The patient was instructed to bring this medication list to his next doctor's appointment and to update the list with any medication changes.  Where indicated, the patient was provided with additional medication and/or disease-state information.    All patient questions were answered and patient acknowledged understanding of the medications, side effects, and other pertinent medication information.  Patient was educated on ACS and the medications that he will be taking for that as well as the importance of compliance.     [name]  01/25/2019

## 2019-01-25 NOTE — Discharge Planning (AHS/AVS)
Myocardial Infarction (MI)-Medicine Information  What is a myocardial infarction?   A myocardial infarction, or ???heart attack,??? occurs when one or more blood vessels that supply the heart become blocked. This may cause the heart muscle to become damaged. Below are some of the medications you may start after a heart attack.     Cholesterol-Lowering Medications (examples: rosuvastatin, atorvastatin, simvastatin)   High cholesterol can cause blockages within the blood vessels of your heart. After a heart attack, it is recommended to start a statin therapy, which can lower the risk of another heart attack.  ? Possible side effects: Nausea, muscle weakness, rash  ? How to take: Take simvastatin, fluvastatin, and lovastatin at nighttime. All others may be taken at any time of day.     Beta-blockers (examples: metoprolol, atenolol, carvedilol)   These medicines can help reduce heart rate and blood pressure, as well as reduce the work that the heart has to do. This is important for hearts that are recovering from a heart attack. Beta blockers may also be helpful in reducing chest pain, or angina.  ? Possible side effects: Fatigue, low blood pressure, dizziness, feeling lightheaded  ? How to take: Take carvedilol (Coreg) with food. All others may be taken without regards to meals.     Angiotensin-converting enzyme inhibitors (???ACE inhibitors??? examples: lisinopril, Ramipril, fosinopril) or Angiotensin-receptor blockers (???ARBs??? examples: losartan, valsartan, olmesartan)   These medicines improve heart function after having a heart attack. They help open up blood vessels and lower blood pressure.  ? Possible side effects: Dry cough, low blood pressure, dizziness  ? How to take: Take captopril and moexipril on an empty stomach. All others may be taken without regards to meals.     Antiplatelet Medications (examples: aspirin, clopidogrel, prasugrel, ticagrelor)

## 2019-01-25 NOTE — Progress Notes
Cardiovascular Inpatient Progress Note    Name:  Manuel Gould   OZDGU'Y Date:  01/25/2019  Admission Date: 01/23/2019  LOS: LOS: 2 days                     Assessment/Plan:    Active Problems:    CAD (coronary artery disease), native coronary artery    Essential hypertension    S/P CABG x 3    COPD (chronic obstructive pulmonary disease) (HCC)    History of basal cell carcinoma    BPH (benign prostatic hyperplasia)    Mr. [name] is an 84 y/o male with past medical history significant for: COPD, CAD s/p CABG 2003 and HTN who was transferred to [facility] for management of NSTEMI    NSTEMI  CAD  - 20 minute episode of CP occurring at rest, troponin elevated at OSH ED to 1.68. Heparin bolus and aspirin loaded prior to transfer.  - Previous Hx of CABG 2003: LIMA to LAD, Radial to Circ, Vein graft to PDA  - Last LHC 11/21/15: Patency of grafts  - Reg MPI 01/08/19: abnormal, demonstrating small???sized basal inferior and inferoseptal???perfusion defect. LVEF 60%  - EKG on arrival: no ST abnormality, persistent RBBB present on prior EKGs noted  - Overall, patient with significant concern for progression of underlying coronary disease given history of progression from Typical Anginal symptoms towards Unstable Angina now with NSTEMI. TIMI score 5.   - Troponin 1.2 -> 1.25, BNP 277  - 4/10 Cath: drug-eluting stents to SVG-RPDA proximal and distal; diffuse severe multivessel disease  - 4/10 Echo: LVEF 55%, limited exam 2/2 technically difficult study  - Lipid panel: Chol 154, TG 87, HDL 40, LDL 98, VLDL 17, non HDL 114  Plan:  > aspirin 81 mg daily  > Plavix load 300mg  today followed by plavix 75 mg qday, unable to afford brillinta & prasugrel contraindicated due to age & black box warning of bleeding in patient's age group  > Lopressor 25 mg twice daily and Imdur 120 mg daily  > Added Zetia  10mg  this admission duet reported simvastatin anaphylaxis *& pt reported intolerance to other statins in past  ???

## 2019-01-26 ENCOUNTER — Encounter: Admit: 2019-01-26 | Discharge: 2019-01-26 | Payer: MEDICARE

## 2019-01-28 LAB — POC ACTIVATED CLOTTING TIME
Lab: 153 s (ref 0.3–1.2)
Lab: 311 s — ABNORMAL LOW (ref 25–110)
Lab: >40 s (ref 21–30)

## 2019-02-16 ENCOUNTER — Encounter: Admit: 2019-02-16 | Discharge: 2019-02-16 | Payer: MEDICARE

## 2019-03-11 ENCOUNTER — Encounter: Admit: 2019-03-11 | Discharge: 2019-03-11 | Payer: MEDICARE

## 2019-03-13 MED ORDER — EZETIMIBE 10 MG PO TAB
ORAL_TABLET | Freq: Every day | 1 refills
Start: 2019-03-13 — End: ?

## 2019-03-17 ENCOUNTER — Encounter: Admit: 2019-03-17 | Discharge: 2019-03-17

## 2019-03-17 ENCOUNTER — Ambulatory Visit: Admit: 2019-03-17 | Discharge: 2019-03-18

## 2019-03-17 DIAGNOSIS — I252 Old myocardial infarction: Secondary | ICD-10-CM

## 2019-03-17 DIAGNOSIS — J984 Other disorders of lung: Secondary | ICD-10-CM

## 2019-03-17 DIAGNOSIS — I25118 Atherosclerotic heart disease of native coronary artery with other forms of angina pectoris: Secondary | ICD-10-CM

## 2019-03-17 DIAGNOSIS — I2510 Atherosclerotic heart disease of native coronary artery without angina pectoris: Secondary | ICD-10-CM

## 2019-03-17 DIAGNOSIS — J449 Chronic obstructive pulmonary disease, unspecified: Secondary | ICD-10-CM

## 2019-03-17 DIAGNOSIS — Z951 Presence of aortocoronary bypass graft: Secondary | ICD-10-CM

## 2019-03-17 DIAGNOSIS — I10 Essential (primary) hypertension: Secondary | ICD-10-CM

## 2019-03-17 DIAGNOSIS — J189 Pneumonia, unspecified organism: Secondary | ICD-10-CM

## 2019-03-17 DIAGNOSIS — J45909 Unspecified asthma, uncomplicated: Secondary | ICD-10-CM

## 2019-03-31 ENCOUNTER — Encounter: Admit: 2019-03-31 | Discharge: 2019-03-31

## 2019-03-31 NOTE — Progress Notes
Pt denies problem bp normal tensive at all recent doctors visits cough improved.

## 2019-04-13 ENCOUNTER — Encounter: Admit: 2019-04-13 | Discharge: 2019-04-13

## 2019-05-05 ENCOUNTER — Encounter: Admit: 2019-05-05 | Discharge: 2019-05-05

## 2019-05-05 MED ORDER — ISOSORBIDE MONONITRATE 120 MG PO TB24
120 mg | ORAL_TABLET | Freq: Every morning | ORAL | 2 refills | 30.00000 days | Status: DC
Start: 2019-05-05 — End: 2020-02-02

## 2019-08-06 ENCOUNTER — Encounter: Admit: 2019-08-06 | Discharge: 2019-08-06 | Payer: MEDICARE

## 2019-09-22 ENCOUNTER — Encounter: Admit: 2019-09-22 | Discharge: 2019-09-22 | Payer: MEDICARE

## 2019-09-22 DIAGNOSIS — J189 Pneumonia, unspecified organism: Secondary | ICD-10-CM

## 2019-09-22 DIAGNOSIS — Z951 Presence of aortocoronary bypass graft: Secondary | ICD-10-CM

## 2019-09-22 DIAGNOSIS — J45909 Unspecified asthma, uncomplicated: Secondary | ICD-10-CM

## 2019-09-22 DIAGNOSIS — J984 Other disorders of lung: Secondary | ICD-10-CM

## 2019-09-22 DIAGNOSIS — J449 Chronic obstructive pulmonary disease, unspecified: Secondary | ICD-10-CM

## 2019-09-22 DIAGNOSIS — I2510 Atherosclerotic heart disease of native coronary artery without angina pectoris: Secondary | ICD-10-CM

## 2019-09-22 DIAGNOSIS — I252 Old myocardial infarction: Secondary | ICD-10-CM

## 2019-09-22 DIAGNOSIS — I10 Essential (primary) hypertension: Secondary | ICD-10-CM

## 2019-10-12 ENCOUNTER — Encounter: Admit: 2019-10-12 | Discharge: 2019-10-12 | Payer: MEDICARE

## 2019-10-13 MED ORDER — CLOPIDOGREL 75 MG PO TAB
ORAL_TABLET | Freq: Every day | 2 refills
Start: 2019-10-13 — End: ?

## 2020-01-06 ENCOUNTER — Encounter: Admit: 2020-01-06 | Discharge: 2020-01-06 | Payer: MEDICARE

## 2020-01-08 MED ORDER — CLOPIDOGREL 75 MG PO TAB
ORAL_TABLET | Freq: Every day | 2 refills
Start: 2020-01-08 — End: ?

## 2020-02-02 MED ORDER — ISOSORBIDE MONONITRATE 120 MG PO TB24
ORAL_TABLET | Freq: Every day | ORAL | 3 refills | 30.00000 days | Status: AC
Start: 2020-02-02 — End: ?

## 2020-02-16 ENCOUNTER — Encounter: Admit: 2020-02-16 | Discharge: 2020-02-16 | Payer: MEDICARE

## 2020-02-16 NOTE — Patient Instructions
Thank you for visiting our office today.    We would like to make the following medication adjustments:      Stop taking PLAVIX.    OK to hold Aspirin for skin procedure.    Do not adjust Metoprolol.       Otherwise continue the same medications as you have been doing.          We will be pursuing the following tests after your appointment today:            We will plan to see you back in 6 months.  Please call us in the meantime with any questions or concerns.        Please allow 5-7 business days for our providers to review your results. All normal results will go to MyChart. If you do not have Mychart, it is strongly recommended to get this so you can easily view all your results. If you do not have mychart, we will attempt to call you once with normal lab and testing results. If we cannot reach you by phone with normal results, we will send you a letter.  If you have not heard the results of your testing after one week please give [place] a call.       Your Cardiovascular Medicine [place] Team Brett Canales, Pilar Jarvis and [place])  phone number is [phone].

## 2020-08-16 ENCOUNTER — Encounter: Admit: 2020-08-16 | Discharge: 2020-08-16 | Payer: MEDICARE

## 2020-08-16 DIAGNOSIS — J449 Chronic obstructive pulmonary disease, unspecified: Secondary | ICD-10-CM

## 2020-08-16 DIAGNOSIS — J189 Pneumonia, unspecified organism: Secondary | ICD-10-CM

## 2020-08-16 DIAGNOSIS — I10 Essential (primary) hypertension: Secondary | ICD-10-CM

## 2020-08-16 DIAGNOSIS — J984 Other disorders of lung: Secondary | ICD-10-CM

## 2020-08-16 DIAGNOSIS — I2510 Atherosclerotic heart disease of native coronary artery without angina pectoris: Principal | ICD-10-CM

## 2020-08-16 DIAGNOSIS — J45909 Unspecified asthma, uncomplicated: Secondary | ICD-10-CM

## 2020-08-16 DIAGNOSIS — I252 Old myocardial infarction: Secondary | ICD-10-CM

## 2020-08-16 DIAGNOSIS — Z951 Presence of aortocoronary bypass graft: Secondary | ICD-10-CM

## 2021-02-01 ENCOUNTER — Encounter: Admit: 2021-02-01 | Discharge: 2021-02-01 | Payer: MEDICARE

## 2021-02-01 MED ORDER — ISOSORBIDE MONONITRATE 120 MG PO TB24
ORAL_TABLET | Freq: Every day | ORAL | 3 refills | 30.00000 days | Status: AC
Start: 2021-02-01 — End: ?

## 2021-06-16 ENCOUNTER — Encounter: Admit: 2021-06-16 | Discharge: 2021-06-16 | Payer: MEDICARE

## 2021-06-16 ENCOUNTER — Inpatient Hospital Stay: Admit: 2021-06-16 | Payer: MEDICARE

## 2021-06-16 ENCOUNTER — Inpatient Hospital Stay: Admit: 2021-06-16 | Discharge: 2021-06-16 | Payer: MEDICARE

## 2021-06-16 DIAGNOSIS — R778 Other specified abnormalities of plasma proteins: Secondary | ICD-10-CM

## 2021-06-16 LAB — BNP (B-TYPE NATRIURETIC PEPTI): BNP: 487 pg/mL — ABNORMAL HIGH (ref 0–100)

## 2021-06-16 LAB — COMPREHENSIVE METABOLIC PANEL
ALBUMIN: 3.4 g/dL — ABNORMAL LOW (ref 3.5–5.0)
ALK PHOSPHATASE: 86 U/L (ref 25–110)
ALT: 15 U/L — ABNORMAL HIGH (ref 7–56)
ANION GAP: 7 K/UL (ref 3–12)
AST: 32 U/L (ref 7–40)
CALCIUM: 8.6 mg/dL (ref 8.5–10.6)
CO2: 26 MMOL/L (ref 21–30)
CREATININE: 0.8 mg/dL (ref 0.4–1.24)
EGFR: >60 mL/min (ref >60)
SODIUM: 138 MMOL/L — ABNORMAL LOW (ref 137–147)
TOTAL BILIRUBIN: 0.8 mg/dL — ABNORMAL HIGH (ref 0.3–1.2)
TOTAL PROTEIN: 5.7 g/dL — ABNORMAL LOW (ref 6.0–8.0)

## 2021-06-16 LAB — MAGNESIUM: MAGNESIUM: 2 mg/dL — ABNORMAL LOW (ref 1.6–2.6)

## 2021-06-16 LAB — TROPONIN-I: TROPONIN I: 0.7 ng/mL — ABNORMAL HIGH (ref 0.0–0.05)

## 2021-06-16 LAB — LIPID PROFILE
CHOLESTEROL: 137 mg/dL (ref <200)
LDL: 85 mg/dL (ref <100)
NON HDL CHOLESTEROL: 97 mg/dL
TRIGLYCERIDES: 61 mg/dL (ref <150)
VLDL: 12 mg/dL

## 2021-06-16 LAB — D-DIMER: D-DIMER: 180 ng{FEU}/mL — ABNORMAL HIGH (ref <500)

## 2021-06-16 LAB — TSH WITH FREE T4 REFLEX: TSH: 1 uU/mL — ABNORMAL LOW (ref >40)

## 2021-06-16 LAB — CBC AND DIFF
ABSOLUTE BASO COUNT: 0 K/UL (ref 0–0.20)
ABSOLUTE EOS COUNT: 0 K/UL (ref 0–0.45)
WBC COUNT: 10 K/UL (ref 4.5–11.0)

## 2021-06-16 LAB — PHOSPHORUS: PHOSPHORUS: 2.2 mg/dL (ref 2.0–4.5)

## 2021-06-16 LAB — PROTIME INR (PT): PROTIME: 12 s — ABNORMAL LOW (ref 9.5–14.2)

## 2021-06-16 LAB — LACTIC ACID(LACTATE): LACTIC ACID: 0.9 MMOL/L (ref 0.5–2.0)

## 2021-06-16 MED ORDER — HEPARIN (PORCINE) BOLUS FOR CONTINUOUS INF (BAG)
20-40 [IU]/kg | INTRAVENOUS | 0 refills | Status: AC
Start: 2021-06-16 — End: ?

## 2021-06-16 MED ORDER — POLYETHYLENE GLYCOL 3350 17 GRAM PO PWPK
1 | Freq: Every day | ORAL | 0 refills | Status: AC | PRN
Start: 2021-06-16 — End: ?

## 2021-06-16 MED ORDER — ONDANSETRON 4 MG PO TBDI
4 mg | ORAL | 0 refills | Status: AC | PRN
Start: 2021-06-16 — End: ?

## 2021-06-16 MED ORDER — HEPARIN (PORCINE) BOLUS FOR CONTINUOUS INF (VIAL)
20-40 [IU]/kg | INTRAVENOUS | 0 refills | Status: DC
Start: 2021-06-16 — End: 2021-06-17

## 2021-06-16 MED ORDER — ACETAMINOPHEN 500 MG PO TAB
500 mg | ORAL | 0 refills | Status: AC | PRN
Start: 2021-06-16 — End: ?
  Administered 2021-06-17 (×2): 500 mg via ORAL

## 2021-06-16 MED ORDER — ASPIRIN 81 MG PO CHEW
243 mg | Freq: Once | ORAL | 0 refills | Status: CP
Start: 2021-06-16 — End: ?
  Administered 2021-06-17: 02:00:00 243 mg via ORAL

## 2021-06-16 MED ORDER — SENNOSIDES-DOCUSATE SODIUM 8.6-50 MG PO TAB
1 | Freq: Every day | ORAL | 0 refills | Status: AC | PRN
Start: 2021-06-16 — End: ?

## 2021-06-16 MED ORDER — ASPIRIN 81 MG PO TBEC
81 mg | Freq: Every day | ORAL | 0 refills | Status: AC
Start: 2021-06-16 — End: ?
  Administered 2021-06-17 – 2021-06-21 (×5): 81 mg via ORAL

## 2021-06-16 MED ORDER — HEPARIN (PORCINE) IN 5 % DEX 20,000 UNIT/500 ML (40 UNIT/ML) IV SOLP
0-2000 [IU]/h | INTRAVENOUS | 0 refills | Status: DC
Start: 2021-06-16 — End: 2021-06-17

## 2021-06-16 MED ORDER — FINASTERIDE 5 MG PO TAB
5 mg | Freq: Every day | ORAL | 0 refills | Status: AC
Start: 2021-06-16 — End: ?
  Administered 2021-06-17 – 2021-06-21 (×5): 5 mg via ORAL

## 2021-06-16 MED ORDER — ISOSORBIDE MONONITRATE 60 MG PO TB24
120 mg | Freq: Every morning | ORAL | 0 refills | Status: AC
Start: 2021-06-16 — End: ?
  Administered 2021-06-17 – 2021-06-21 (×5): 120 mg via ORAL

## 2021-06-16 MED ORDER — BUDESONIDE 0.5 MG/2 ML IN NBSP
.5 mg | Freq: Two times a day (BID) | RESPIRATORY_TRACT | 0 refills | Status: AC
Start: 2021-06-16 — End: ?
  Administered 2021-06-17 – 2021-06-21 (×10): 0.5 mg via RESPIRATORY_TRACT

## 2021-06-16 MED ORDER — ALBUTEROL SULFATE 2.5 MG /3 ML (0.083 %) IN NEBU
2.5 mg | RESPIRATORY_TRACT | 0 refills | Status: AC | PRN
Start: 2021-06-16 — End: ?

## 2021-06-16 MED ORDER — METOPROLOL TARTRATE 50 MG PO TAB
50 mg | Freq: Every day | ORAL | 0 refills | Status: AC
Start: 2021-06-16 — End: ?
  Administered 2021-06-17: 15:00:00 50 mg via ORAL

## 2021-06-16 MED ORDER — HEPARIN (PORCINE) IN 5 % DEX 20,000 UNIT/500 ML (40 UNIT/ML) IV SOLP
0-2000 [IU]/h | INTRAVENOUS | 0 refills | Status: AC
Start: 2021-06-16 — End: ?
  Administered 2021-06-17: 03:00:00 1000 [IU]/h via INTRAVENOUS
  Administered 2021-06-17: 21:00:00 1200 [IU]/h via INTRAVENOUS

## 2021-06-16 MED ORDER — MELATONIN 5 MG PO TAB
5 mg | Freq: Every evening | ORAL | 0 refills | Status: AC | PRN
Start: 2021-06-16 — End: ?
  Administered 2021-06-17 – 2021-06-21 (×3): 5 mg via ORAL

## 2021-06-16 MED ORDER — ONDANSETRON HCL (PF) 4 MG/2 ML IJ SOLN
4 mg | INTRAVENOUS | 0 refills | Status: AC | PRN
Start: 2021-06-16 — End: ?

## 2021-06-17 ENCOUNTER — Inpatient Hospital Stay: Admit: 2021-06-17 | Discharge: 2021-06-17 | Payer: MEDICARE

## 2021-06-17 ENCOUNTER — Encounter: Admit: 2021-06-17 | Discharge: 2021-06-17 | Payer: MEDICARE

## 2021-06-17 ENCOUNTER — Inpatient Hospital Stay: Admit: 2021-06-17 | Discharge: 2021-06-16 | Payer: MEDICARE

## 2021-06-17 DIAGNOSIS — I25110 Atherosclerotic heart disease of native coronary artery with unstable angina pectoris: Secondary | ICD-10-CM

## 2021-06-17 MED ORDER — RP DX TC-99M TETROFOSMIN MCI
4 | Freq: Once | INTRAVENOUS | 0 refills | Status: CP
Start: 2021-06-17 — End: ?
  Administered 2021-06-17: 19:00:00 4.52 via INTRAVENOUS

## 2021-06-17 MED ORDER — SODIUM CHLORIDE 0.9 % IV SOLP
250 mL | INTRAVENOUS | 0 refills | Status: AC | PRN
Start: 2021-06-17 — End: ?

## 2021-06-17 MED ORDER — AMINOPHYLLINE 500 MG/20 ML IV SOLN
50 mg | INTRAVENOUS | 0 refills | Status: AC | PRN
Start: 2021-06-17 — End: ?

## 2021-06-17 MED ORDER — NITROGLYCERIN 0.4 MG SL SUBL
.4 mg | SUBLINGUAL | 0 refills | Status: AC | PRN
Start: 2021-06-17 — End: ?

## 2021-06-17 MED ORDER — RP DX TC-99M TETROFOSMIN MCI
13 | Freq: Once | INTRAVENOUS | 0 refills | Status: CP
Start: 2021-06-17 — End: ?
  Administered 2021-06-17: 19:00:00 13.5 via INTRAVENOUS

## 2021-06-17 MED ORDER — REGADENOSON 0.4 MG/5 ML IV SYRG
.4 mg | Freq: Once | INTRAVENOUS | 0 refills | Status: CP
Start: 2021-06-17 — End: ?
  Administered 2021-06-17: 18:00:00 0.4 mg via INTRAVENOUS

## 2021-06-17 MED ORDER — PERFLUTREN LIPID MICROSPHERES 1.1 MG/ML IV SUSP
1-10 mL | Freq: Once | INTRAVENOUS | 0 refills | Status: CP | PRN
Start: 2021-06-17 — End: ?
  Administered 2021-06-17: 20:00:00 3 mL via INTRAVENOUS

## 2021-06-17 MED ORDER — OXYCODONE 10 MG PO TAB
5 mg | ORAL | 0 refills | Status: AC | PRN
Start: 2021-06-17 — End: ?
  Administered 2021-06-17 – 2021-06-21 (×4): 5 mg via ORAL

## 2021-06-17 MED ORDER — EUCALYPTUS-MENTHOL MM LOZG
1 | Freq: Once | ORAL | 0 refills | Status: AC | PRN
Start: 2021-06-17 — End: ?

## 2021-06-17 MED ORDER — ALBUTEROL SULFATE 90 MCG/ACTUATION IN HFAA
2 | RESPIRATORY_TRACT | 0 refills | Status: AC | PRN
Start: 2021-06-17 — End: ?

## 2021-06-17 MED ADMIN — FENTANYL CITRATE (PF) 50 MCG/ML IJ SOLN [3037]: 25 ug | INTRAVENOUS | @ 13:00:00 | NDC 00409909412

## 2021-06-17 MED ADMIN — EZETIMIBE 10 MG PO TAB [86887]: 10 mg | ORAL | @ 19:00:00 | NDC 67877049030

## 2021-06-17 MED ADMIN — IOHEXOL 350 MG IODINE/ML IV SOLN [81210]: 75 mL | INTRAVENOUS | @ 06:00:00 | Stop: 2021-06-17 | NDC 00407141491

## 2021-06-17 MED ADMIN — SODIUM CHLORIDE 0.9 % IJ SOLN [7319]: 50 mL | INTRAVENOUS | @ 23:00:00 | Stop: 2021-06-17 | NDC 00409488850

## 2021-06-17 MED ADMIN — SODIUM CHLORIDE 0.9 % IJ SOLN [7319]: 50 mL | INTRAVENOUS | @ 06:00:00 | Stop: 2021-06-17 | NDC 00409488850

## 2021-06-17 MED ADMIN — IOHEXOL 350 MG IODINE/ML IV SOLN [81210]: 85 mL | INTRAVENOUS | @ 23:00:00 | Stop: 2021-06-17 | NDC 00407141491

## 2021-06-17 NOTE — Progress Notes
Vascular Access Team consulted to obtain lab specimen.    Ultrasound Used: Yes    How Many Attempts: 1    Location of Unsuccessful Attempts: n/a    At 2100 approximately 20 ml of blood obtained from RAC. Patient tolerated the procedure well. Specimen labeled and sent to lab.

## 2021-06-18 ENCOUNTER — Inpatient Hospital Stay: Admit: 2021-06-18 | Discharge: 2021-06-18 | Payer: MEDICARE

## 2021-06-18 MED ADMIN — LOSARTAN 25 MG PO TAB [80886]: 12.5 mg | ORAL | @ 15:00:00 | NDC 00904704761

## 2021-06-18 MED ADMIN — FENTANYL CITRATE (PF) 50 MCG/ML IJ SOLN [3037]: 25 ug | INTRAVENOUS | @ 04:00:00 | NDC 00409909412

## 2021-06-18 MED ADMIN — EZETIMIBE 10 MG PO TAB [86887]: 10 mg | ORAL | @ 15:00:00 | NDC 67877049030

## 2021-06-18 MED ADMIN — METOPROLOL TARTRATE 50 MG PO TAB [5009]: 50 mg | ORAL | @ 15:00:00 | Stop: 2021-06-18 | NDC 62584026611

## 2021-06-18 MED ADMIN — CYANOCOBALAMIN (VITAMIN B-12) 1,000 MCG/ML IJ SOLN [2007]: 1000 ug | INTRAMUSCULAR | @ 15:00:00 | NDC 69680011201

## 2021-06-19 MED ADMIN — LOSARTAN 25 MG PO TAB [80886]: 12.5 mg | ORAL | @ 14:00:00 | NDC 00904704761

## 2021-06-19 MED ADMIN — CYANOCOBALAMIN (VITAMIN B-12) 1,000 MCG/ML IJ SOLN [2007]: 1000 ug | INTRAMUSCULAR | @ 14:00:00 | NDC 69680011201

## 2021-06-19 MED ADMIN — METOPROLOL SUCCINATE 25 MG PO TB24 [81866]: 75 mg | ORAL | @ 14:00:00 | NDC 00904632261

## 2021-06-19 MED ADMIN — METOPROLOL SUCCINATE 50 MG PO TB24 [77931]: 75 mg | ORAL | @ 14:00:00 | NDC 00904632361

## 2021-06-19 MED ADMIN — EZETIMIBE 10 MG PO TAB [86887]: 10 mg | ORAL | @ 14:00:00 | NDC 67877049030

## 2021-06-20 ENCOUNTER — Encounter: Admit: 2021-06-20 | Discharge: 2021-06-20 | Payer: MEDICARE

## 2021-06-20 DIAGNOSIS — Z951 Presence of aortocoronary bypass graft: Secondary | ICD-10-CM

## 2021-06-20 DIAGNOSIS — J189 Pneumonia, unspecified organism: Secondary | ICD-10-CM

## 2021-06-20 DIAGNOSIS — I2510 Atherosclerotic heart disease of native coronary artery without angina pectoris: Secondary | ICD-10-CM

## 2021-06-20 DIAGNOSIS — J449 Chronic obstructive pulmonary disease, unspecified: Secondary | ICD-10-CM

## 2021-06-20 DIAGNOSIS — I252 Old myocardial infarction: Secondary | ICD-10-CM

## 2021-06-20 DIAGNOSIS — I10 Essential (primary) hypertension: Secondary | ICD-10-CM

## 2021-06-20 DIAGNOSIS — J45909 Unspecified asthma, uncomplicated: Secondary | ICD-10-CM

## 2021-06-20 DIAGNOSIS — J984 Other disorders of lung: Secondary | ICD-10-CM

## 2021-06-20 MED ADMIN — METOPROLOL SUCCINATE 25 MG PO TB24 [81866]: 75 mg | ORAL | @ 13:00:00 | NDC 00904632261

## 2021-06-20 MED ADMIN — DAPAGLIFLOZIN 10 MG PO TAB [320172]: 10 mg | ORAL | @ 20:00:00 | NDC 00310621030

## 2021-06-20 MED ADMIN — ACETAMINOPHEN 500 MG PO TAB [102]: 1000 mg | ORAL | @ 06:00:00 | NDC 00904673061

## 2021-06-20 MED ADMIN — CYANOCOBALAMIN (VITAMIN B-12) 1,000 MCG/ML IJ SOLN [2007]: 1000 ug | INTRAMUSCULAR | @ 13:00:00 | NDC 69680011201

## 2021-06-20 MED ADMIN — LOSARTAN 25 MG PO TAB [80886]: 12.5 mg | ORAL | @ 13:00:00 | NDC 00904704761

## 2021-06-20 MED ADMIN — METOPROLOL SUCCINATE 50 MG PO TB24 [77931]: 75 mg | ORAL | @ 13:00:00 | NDC 00904632361

## 2021-06-20 MED ADMIN — EZETIMIBE 10 MG PO TAB [86887]: 10 mg | ORAL | @ 13:00:00 | NDC 67877049030

## 2021-06-21 ENCOUNTER — Encounter: Admit: 2021-06-21 | Discharge: 2021-06-21 | Payer: MEDICARE

## 2021-06-21 MED ADMIN — CYANOCOBALAMIN (VITAMIN B-12) 1,000 MCG/ML IJ SOLN [2007]: 1000 ug | INTRAMUSCULAR | @ 15:00:00 | Stop: 2021-06-21 | NDC 69680011201

## 2021-06-21 MED ADMIN — METOPROLOL SUCCINATE 50 MG PO TB24 [77931]: 75 mg | ORAL | @ 15:00:00 | Stop: 2021-06-21 | NDC 00904632361

## 2021-06-21 MED ADMIN — EZETIMIBE 10 MG PO TAB [86887]: 10 mg | ORAL | @ 15:00:00 | Stop: 2021-06-21 | NDC 67877049030

## 2021-06-21 MED ADMIN — DAPAGLIFLOZIN 10 MG PO TAB [320172]: 10 mg | ORAL | @ 15:00:00 | Stop: 2021-06-21 | NDC 00310621030

## 2021-06-21 MED ADMIN — METOPROLOL SUCCINATE 25 MG PO TB24 [81866]: 75 mg | ORAL | @ 15:00:00 | Stop: 2021-06-21 | NDC 00904632261

## 2021-06-21 MED ADMIN — ACETAMINOPHEN 500 MG PO TAB [102]: 1000 mg | ORAL | @ 18:00:00 | Stop: 2021-06-21 | NDC 00904673080

## 2021-06-21 MED ADMIN — ENOXAPARIN 40 MG/0.4 ML SC SYRG [85052]: 40 mg | SUBCUTANEOUS | @ 01:00:00 | NDC 00781324602

## 2021-06-21 MED ADMIN — LOSARTAN 25 MG PO TAB [80886]: 12.5 mg | ORAL | @ 15:00:00 | Stop: 2021-06-21 | NDC 00904704761

## 2021-06-21 MED FILL — DAPAGLIFLOZIN 10 MG PO TAB: 10 mg | ORAL | 30 days supply | Status: CN

## 2021-08-22 ENCOUNTER — Encounter: Admit: 2021-08-22 | Discharge: 2021-08-22 | Payer: MEDICARE

## 2021-08-29 ENCOUNTER — Encounter: Admit: 2021-08-29 | Discharge: 2021-08-29 | Payer: MEDICARE

## 2021-08-29 DIAGNOSIS — I2510 Atherosclerotic heart disease of native coronary artery without angina pectoris: Secondary | ICD-10-CM

## 2021-08-29 DIAGNOSIS — J45909 Unspecified asthma, uncomplicated: Secondary | ICD-10-CM

## 2021-08-29 DIAGNOSIS — I10 Essential (primary) hypertension: Secondary | ICD-10-CM

## 2021-08-29 DIAGNOSIS — J449 Chronic obstructive pulmonary disease, unspecified: Secondary | ICD-10-CM

## 2021-08-29 DIAGNOSIS — J984 Other disorders of lung: Secondary | ICD-10-CM

## 2021-08-29 DIAGNOSIS — I252 Old myocardial infarction: Secondary | ICD-10-CM

## 2021-08-29 DIAGNOSIS — Z951 Presence of aortocoronary bypass graft: Secondary | ICD-10-CM

## 2021-08-29 DIAGNOSIS — J189 Pneumonia, unspecified organism: Secondary | ICD-10-CM

## 2022-02-08 ENCOUNTER — Encounter: Admit: 2022-02-08 | Discharge: 2022-02-08 | Payer: MEDICARE

## 2022-02-08 DIAGNOSIS — J45909 Unspecified asthma, uncomplicated: Secondary | ICD-10-CM

## 2022-02-08 DIAGNOSIS — J189 Pneumonia, unspecified organism: Secondary | ICD-10-CM

## 2022-02-08 DIAGNOSIS — I5020 Unspecified systolic (congestive) heart failure: Secondary | ICD-10-CM

## 2022-02-08 DIAGNOSIS — I2510 Atherosclerotic heart disease of native coronary artery without angina pectoris: Secondary | ICD-10-CM

## 2022-02-08 DIAGNOSIS — I252 Old myocardial infarction: Secondary | ICD-10-CM

## 2022-02-08 DIAGNOSIS — I25118 Atherosclerotic heart disease of native coronary artery with other forms of angina pectoris: Secondary | ICD-10-CM

## 2022-02-08 DIAGNOSIS — Z951 Presence of aortocoronary bypass graft: Secondary | ICD-10-CM

## 2022-02-08 DIAGNOSIS — I10 Essential (primary) hypertension: Secondary | ICD-10-CM

## 2022-02-08 DIAGNOSIS — J984 Other disorders of lung: Secondary | ICD-10-CM

## 2022-02-08 DIAGNOSIS — J449 Chronic obstructive pulmonary disease, unspecified: Secondary | ICD-10-CM

## 2022-03-03 ENCOUNTER — Encounter: Admit: 2022-03-03 | Discharge: 2022-03-03 | Payer: MEDICARE

## 2022-03-03 MED ORDER — ISOSORBIDE MONONITRATE 120 MG PO TB24
ORAL_TABLET | 3 refills
Start: 2022-03-03 — End: ?

## 2022-05-25 ENCOUNTER — Encounter: Admit: 2022-05-25 | Discharge: 2022-05-25 | Payer: MEDICARE

## 2022-06-02 ENCOUNTER — Encounter: Admit: 2022-06-02 | Discharge: 2022-06-02 | Payer: MEDICARE

## 2022-07-14 ENCOUNTER — Encounter: Admit: 2022-07-14 | Discharge: 2022-07-14 | Payer: MEDICARE

## 2022-08-09 ENCOUNTER — Encounter: Admit: 2022-08-09 | Discharge: 2022-08-09 | Payer: MEDICARE

## 2022-08-09 DIAGNOSIS — I517 Cardiomegaly: Secondary | ICD-10-CM

## 2022-08-09 DIAGNOSIS — I10 Essential (primary) hypertension: Secondary | ICD-10-CM

## 2022-08-09 DIAGNOSIS — Z951 Presence of aortocoronary bypass graft: Secondary | ICD-10-CM

## 2022-08-09 DIAGNOSIS — J189 Pneumonia, unspecified organism: Secondary | ICD-10-CM

## 2022-08-09 DIAGNOSIS — J449 Chronic obstructive pulmonary disease, unspecified: Secondary | ICD-10-CM

## 2022-08-09 DIAGNOSIS — I5020 Unspecified systolic (congestive) heart failure: Secondary | ICD-10-CM

## 2022-08-09 DIAGNOSIS — I2510 Atherosclerotic heart disease of native coronary artery without angina pectoris: Secondary | ICD-10-CM

## 2022-08-09 DIAGNOSIS — J45909 Unspecified asthma, uncomplicated: Secondary | ICD-10-CM

## 2022-08-09 DIAGNOSIS — I252 Old myocardial infarction: Secondary | ICD-10-CM

## 2022-08-09 DIAGNOSIS — J984 Other disorders of lung: Secondary | ICD-10-CM

## 2022-08-09 NOTE — Patient Instructions
Follow up in 6 months    Follow up as directed.  Call sooner if issues.  Call the [place] line at [phone].  Leave a detailed message for the nurse in [place] with how we can assist you and we will call you back.

## 2023-01-21 ENCOUNTER — Encounter: Admit: 2023-01-21 | Discharge: 2023-01-21 | Payer: MEDICARE

## 2023-02-15 ENCOUNTER — Encounter: Admit: 2023-02-15 | Discharge: 2023-02-15 | Payer: MEDICARE

## 2023-02-18 ENCOUNTER — Encounter: Admit: 2023-02-18 | Discharge: 2023-02-18 | Payer: MEDICARE

## 2023-03-04 ENCOUNTER — Encounter: Admit: 2023-03-04 | Discharge: 2023-03-04 | Payer: MEDICARE

## 2023-03-04 MED ORDER — ISOSORBIDE MONONITRATE 120 MG PO TB24
120 mg | ORAL_TABLET | Freq: Every morning | ORAL | 3 refills | 90.00000 days | Status: AC
Start: 2023-03-04 — End: ?

## 2023-06-18 ENCOUNTER — Encounter: Admit: 2023-06-18 | Discharge: 2023-06-18 | Payer: MEDICARE

## 2023-06-18 DIAGNOSIS — J984 Other disorders of lung: Secondary | ICD-10-CM

## 2023-06-18 DIAGNOSIS — Z951 Presence of aortocoronary bypass graft: Secondary | ICD-10-CM

## 2023-06-18 DIAGNOSIS — I25118 Atherosclerotic heart disease of native coronary artery with other forms of angina pectoris: Secondary | ICD-10-CM

## 2023-06-18 DIAGNOSIS — I10 Essential (primary) hypertension: Secondary | ICD-10-CM

## 2023-06-18 DIAGNOSIS — J189 Pneumonia, unspecified organism: Secondary | ICD-10-CM

## 2023-06-18 DIAGNOSIS — J449 Chronic obstructive pulmonary disease, unspecified: Secondary | ICD-10-CM

## 2023-06-18 DIAGNOSIS — J45909 Unspecified asthma, uncomplicated: Secondary | ICD-10-CM

## 2023-06-18 DIAGNOSIS — Z136 Encounter for screening for cardiovascular disorders: Secondary | ICD-10-CM

## 2023-06-18 DIAGNOSIS — I517 Cardiomegaly: Secondary | ICD-10-CM

## 2023-06-18 DIAGNOSIS — I2510 Atherosclerotic heart disease of native coronary artery without angina pectoris: Secondary | ICD-10-CM

## 2023-06-18 DIAGNOSIS — I252 Old myocardial infarction: Secondary | ICD-10-CM

## 2023-06-18 DIAGNOSIS — I5020 Unspecified systolic (congestive) heart failure: Secondary | ICD-10-CM

## 2023-06-18 NOTE — Patient Instructions
Thank you for visiting our office today.    We would like to make the following medication adjustments:  NONE       Otherwise continue the same medications as you have been doing.          We will be pursuing the following tests after your appointment today:       Orders Placed This Encounter    ECG 12-LEAD         We will plan to see you back in April-May  Please call us in the meantime with any questions or concerns.        Please allow 5-7 business days for our providers to review your results. All normal results will go to MyChart. If you do not have Mychart, it is strongly recommended to get this so you can easily view all your results. If you do not have mychart, we will attempt to call you once with normal lab and testing results. If we cannot reach you by phone with normal results, we will send you a letter.  If you have not heard the results of your testing after one week please give [place] a call.       Your Cardiovascular Medicine [place] [facility] Team Brett Canales, Pilar Jarvis, Shawna [place], and [place])  phone number is [phone].

## 2023-09-26 ENCOUNTER — Encounter: Admit: 2023-09-26 | Discharge: 2023-09-27 | Payer: MEDICARE

## 2023-09-26 ENCOUNTER — Encounter: Admit: 2023-09-26 | Discharge: 2023-09-26 | Payer: MEDICARE

## 2023-09-30 ENCOUNTER — Encounter: Admit: 2023-09-30 | Discharge: 2023-09-30 | Payer: MEDICARE

## 2023-10-06 ENCOUNTER — Encounter: Admit: 2023-10-06 | Discharge: 2023-10-06 | Payer: MEDICARE

## 2023-11-06 IMAGING — CR ANKCMRT
3 series · 3 of 3 positions shown · non-contrast
Comparison: none

[ankle ap]
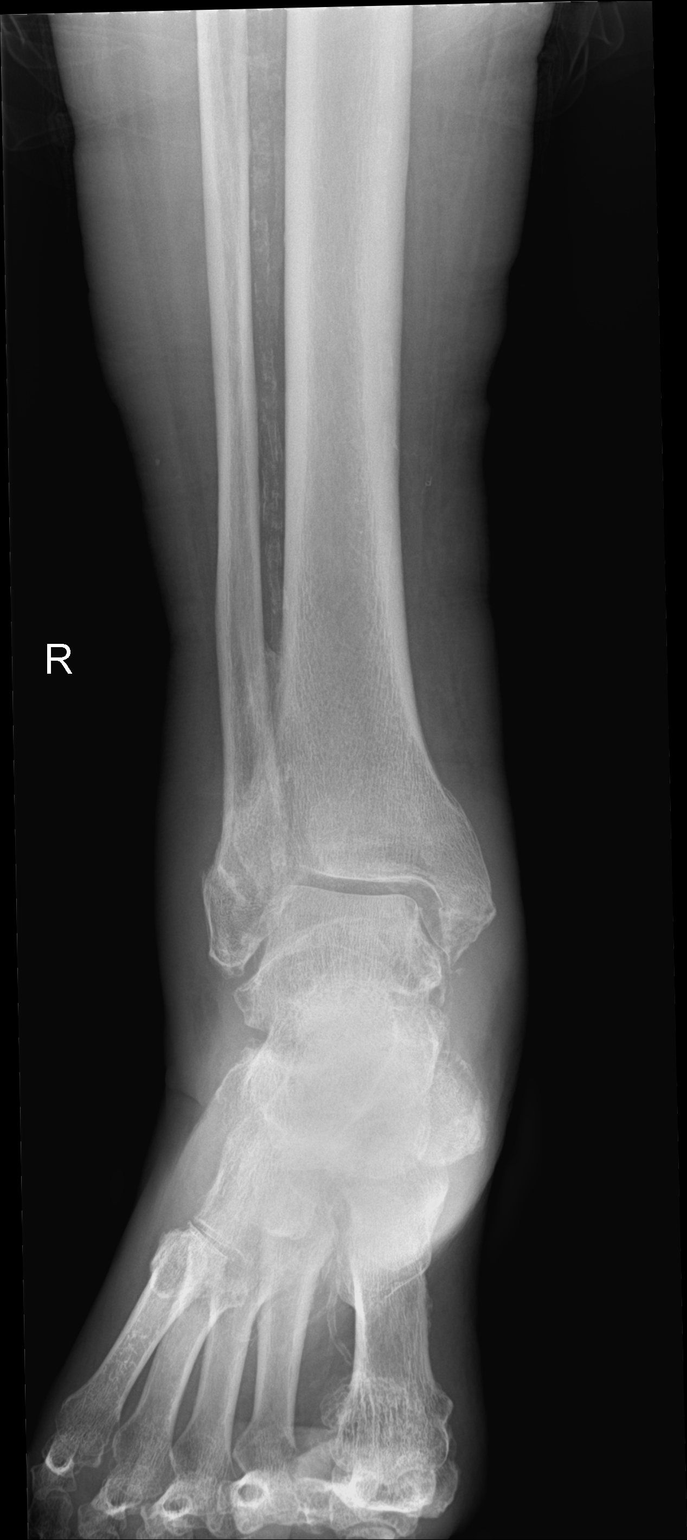

[ankle obl]
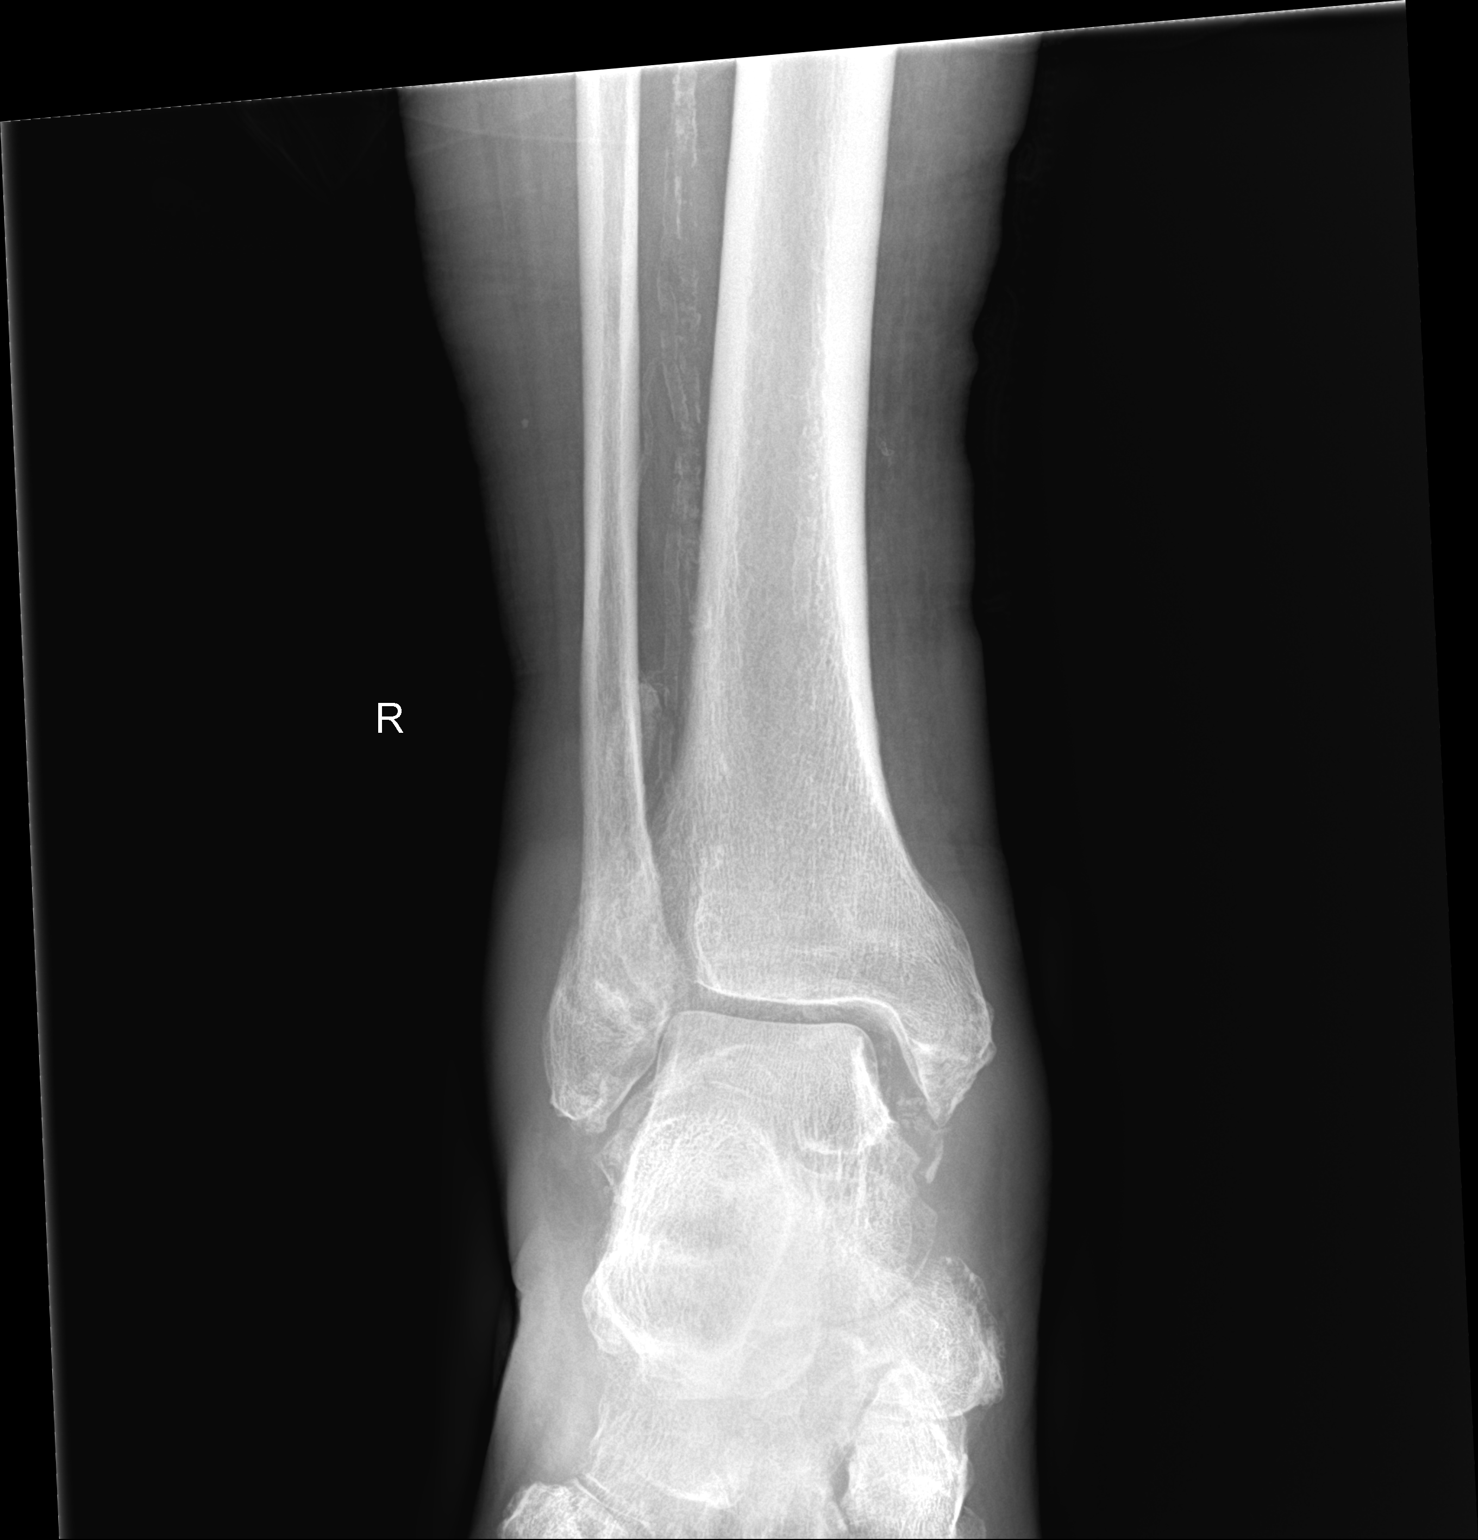

[ankle lat]
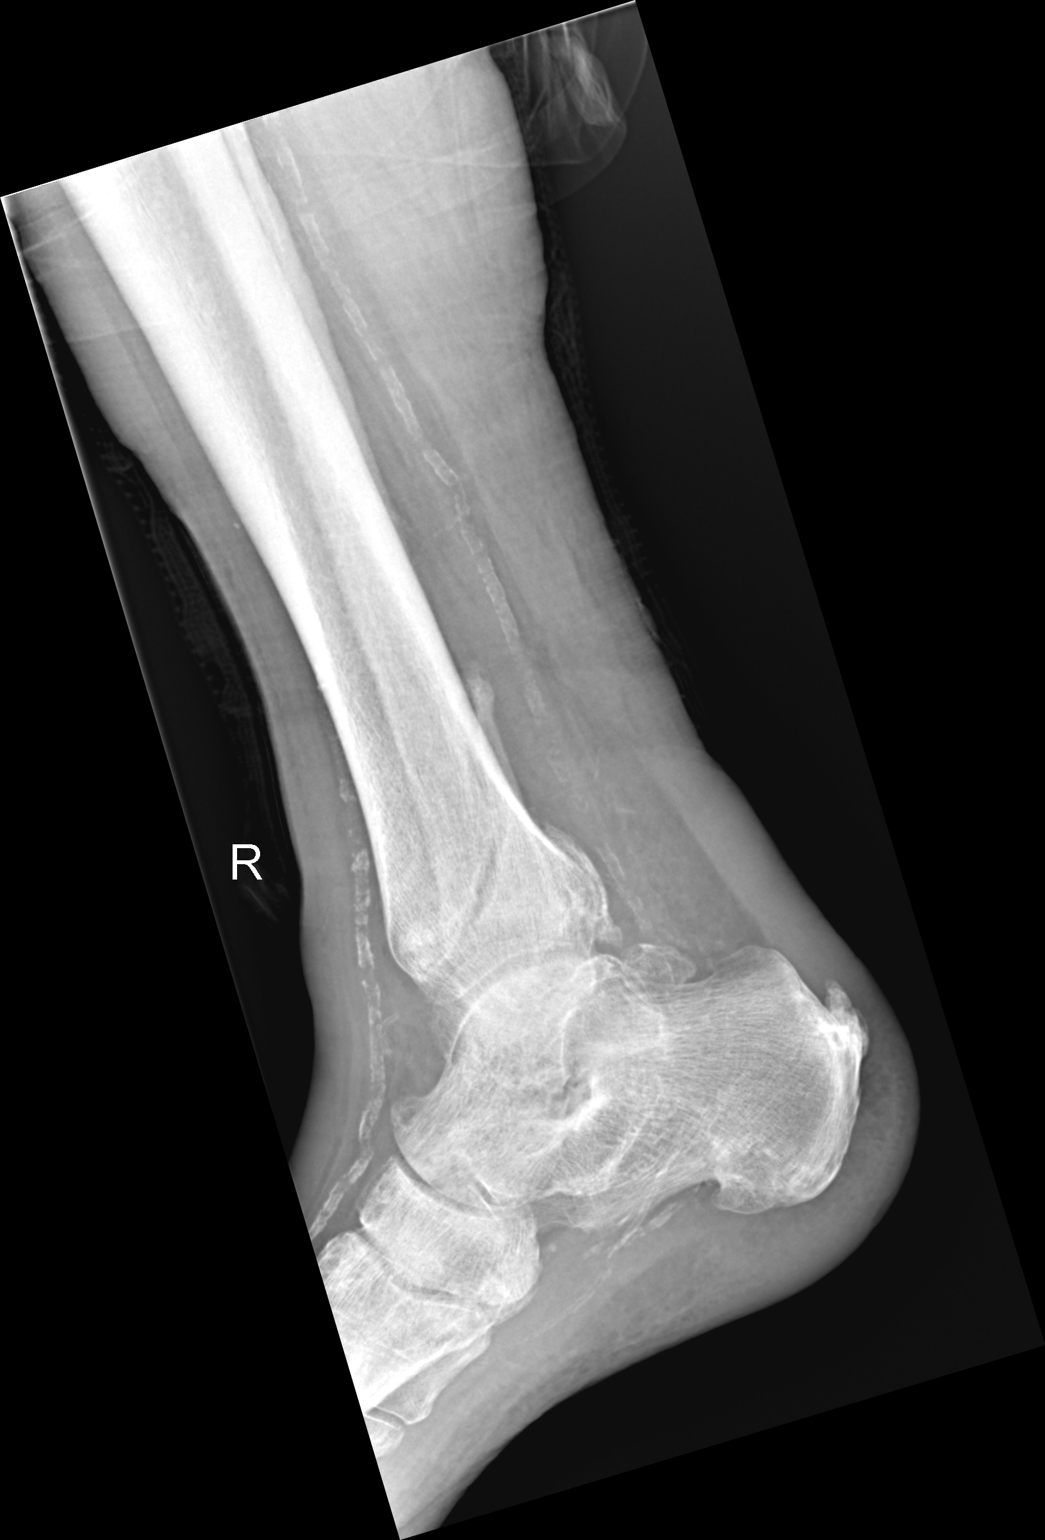

[3 of 3 positions shown; findings below may reference images not displayed]

DIAGNOSTIC STUDIES

EXAM

XR ankle RT min 3V

INDICATION

fx f/u
F/U FX 4 MONTHS AGO. KF

TECHNIQUE

AP lateral and oblique views

COMPARISONS

September 28, 2021

FINDINGS

There has been further healing of the lateral malleolar fracture. Dystrophic calcifications can be
seen along the medial ankle. No new fractures are seen.

IMPRESSION

Further healing of the lateral malleolar fracture without angulation or displacement.

Tech Notes:

F/U FX 4 MONTHS AGO. KF

## 2023-12-12 ENCOUNTER — Encounter: Admit: 2023-12-12 | Discharge: 2023-12-12 | Payer: MEDICARE

## 2023-12-17 ENCOUNTER — Encounter: Admit: 2023-12-17 | Discharge: 2023-12-17 | Payer: MEDICARE

## 2023-12-17 ENCOUNTER — Ambulatory Visit: Admit: 2023-12-17 | Discharge: 2023-12-18 | Payer: MEDICARE

## 2024-01-24 ENCOUNTER — Encounter: Admit: 2024-01-24 | Discharge: 2024-01-24 | Payer: MEDICARE

## 2024-01-24 ENCOUNTER — Ambulatory Visit: Admit: 2024-01-24 | Discharge: 2024-01-24 | Payer: MEDICARE

## 2024-01-30 ENCOUNTER — Encounter: Admit: 2024-01-30 | Discharge: 2024-01-30 | Payer: MEDICARE

## 2024-02-05 ENCOUNTER — Encounter: Admit: 2024-02-05 | Discharge: 2024-02-05 | Payer: MEDICARE

## 2024-02-05 NOTE — Telephone Encounter
 I called Manuel Gould to see why we are seeing him tomorrow. He saw SKO in March and she requested a 6 month follow up. Scheduling note says ED follow up. He was hospitalized in Dec and has been seen since then. No answer and no voice mail.

## 2024-02-06 ENCOUNTER — Encounter: Admit: 2024-02-06 | Discharge: 2024-02-06 | Payer: MEDICARE

## 2024-02-06 ENCOUNTER — Ambulatory Visit: Admit: 2024-02-06 | Discharge: 2024-02-07 | Payer: MEDICARE

## 2024-02-06 NOTE — Telephone Encounter
 [facility]    Elspeth Hals      Fax [phone]     P  [phone]         Patient   Manuel Gould    DOB Dec 09, 1934    Patient was hospitalize on 4/10-4/14    Please fax all records and any testing done to [facility] Team at [phone]    STAT REQUEST, PT IS BEING SEEN TODAY 02/06/24

## 2024-02-09 ENCOUNTER — Encounter: Admit: 2024-02-09 | Discharge: 2024-02-09 | Payer: MEDICARE

## 2024-02-11 ENCOUNTER — Encounter: Admit: 2024-02-11 | Discharge: 2024-02-11 | Payer: MEDICARE

## 2024-02-12 ENCOUNTER — Encounter: Admit: 2024-02-12 | Discharge: 2024-02-12 | Payer: MEDICARE

## 2024-02-12 NOTE — Progress Notes
 Pharmacy Benefits Investigation    Medication name: sacubitriL-valsartan (ENTRESTO) 24-26 mg tablet  Medication status: new  Medication regimen: Take 1 tablet by mouth twice daily    The medication is covered by insurance with no specified end date.    The out of pocket cost today is $149.07 for 30 days. This cost may change due to factors including but not limited to changes in insurance coverage.    A grant is available through [employer]. A grant was obtained and will provide the patient with $10000 through 01/11/2025.    After assistance, the final out of pocket cost is $0.    The copay is affordable.    The medication cannot be delivered until the address has been confirmed. Contacted the patient to confirm the delivery address. Left voicemail asking for a return call to the specialty pharmacy at [phone].    [facility]  Specialty Pharmacy Patient Advocate

## 2024-02-14 ENCOUNTER — Encounter: Admit: 2024-02-14 | Discharge: 2024-02-14 | Payer: MEDICARE

## 2024-02-17 ENCOUNTER — Encounter: Admit: 2024-02-17 | Discharge: 2024-02-17 | Payer: MEDICARE

## 2024-02-18 ENCOUNTER — Encounter: Admit: 2024-02-18 | Discharge: 2024-02-18 | Payer: MEDICARE

## 2024-02-18 MED FILL — ENTRESTO 24-26 MG PO TAB: 24-26 mg | ORAL | 30 days supply | Qty: 60 | Fill #1 | Status: AC

## 2024-02-21 ENCOUNTER — Encounter: Admit: 2024-02-21 | Discharge: 2024-02-21 | Payer: MEDICARE

## 2024-02-21 DIAGNOSIS — I517 Cardiomegaly: Secondary | ICD-10-CM

## 2024-02-21 DIAGNOSIS — E871 Hypo-osmolality and hyponatremia: Secondary | ICD-10-CM

## 2024-02-21 LAB — BASIC METABOLIC PANEL
ANION GAP: 6 — ABNORMAL LOW (ref 8–16)
BLD UREA NITROGEN: 6 — ABNORMAL LOW (ref 8.4–25.7)
CALCIUM: 8.4
CHLORIDE: 93 — ABNORMAL LOW (ref 98–107)
CO2: 29
CREATININE: 0.7
GLUCOSE,PANEL: 126 — ABNORMAL HIGH (ref 70–105)
POTASSIUM: 3.9
SODIUM: 128 — ABNORMAL LOW (ref 136–145)

## 2024-02-21 NOTE — Telephone Encounter
 Spoke to pt and reviewed recommendations. Faxed lab order to [facility] at [phone]      ===View-only below this line===  ----- Message -----  From: Alexandria Ida, APRN-NP  Sent: 02/21/2024  12:39 PM CDT  To: [facility] Nurse Gen Card Team [place]    His sodium is back down. Will you please advise following a 1500 ml per day fluid restriction and limit free water. Try to drink sports drinks, milk, coconut water. Let's repeat a BMP on Monday.   Thanks, Aleda Hurl    ----- Message -----  From: [place], RN  Sent: 02/21/2024  11:48 AM CDT  To: Alexandria Ida, APRN-NP    Aleda Hurl - labs post entresto start. Please review/advise. Thanks! Harding Li, RN

## 2024-02-26 ENCOUNTER — Encounter: Admit: 2024-02-26 | Discharge: 2024-02-26 | Payer: MEDICARE

## 2024-02-26 DIAGNOSIS — E871 Hypo-osmolality and hyponatremia: Secondary | ICD-10-CM

## 2024-02-26 LAB — BASIC METABOLIC PANEL
ANION GAP: 6 — ABNORMAL LOW (ref 8–16)
BLD UREA NITROGEN: 7.9 — ABNORMAL LOW (ref 8.4–25.7)
CALCIUM: 8.7
CHLORIDE: 96 — ABNORMAL LOW (ref 98–107)
CO2: 26 (ref 1.005–1.030)
CREATININE: 0.7 — ABNORMAL LOW (ref 0.72–1.25)
GFR ESTIMATED: 89
GLUCOSE,PANEL: 114 — ABNORMAL HIGH (ref 70–105)
POTASSIUM: 4.2
SODIUM: 128 — ABNORMAL LOW (ref 136–145)

## 2024-03-05 ENCOUNTER — Ambulatory Visit
Admit: 2024-03-05 | Discharge: 2024-03-06 | Payer: MEDICARE | Primary: Student in an Organized Health Care Education/Training Program

## 2024-03-05 ENCOUNTER — Encounter
Admit: 2024-03-05 | Discharge: 2024-03-05 | Payer: MEDICARE | Primary: Student in an Organized Health Care Education/Training Program

## 2024-03-06 ENCOUNTER — Encounter
Admit: 2024-03-06 | Discharge: 2024-03-06 | Payer: MEDICARE | Primary: Student in an Organized Health Care Education/Training Program

## 2024-03-16 ENCOUNTER — Encounter
Admit: 2024-03-16 | Discharge: 2024-03-16 | Payer: MEDICARE | Primary: Student in an Organized Health Care Education/Training Program

## 2024-03-16 NOTE — Telephone Encounter
 03/16/24 @ 0923am- Call placed to pt, spoke with Sheryle Donning ( spouse) verified pts name and DOB. Per spouse they will obtain labs on 03/19/24 at [facility] in [place] as pt has MRI appt set reminder to request results.         ----- Message -----  From: Lorain Robson, RN  Sent: 03/16/2024  12:00 AM CDT  To: [facility] Nurse Gen Card Team Gold  Subject: lab                                              Remind pt to obtain labs- order faxed to [facility] in [place]

## 2024-03-20 ENCOUNTER — Encounter
Admit: 2024-03-20 | Discharge: 2024-03-20 | Payer: MEDICARE | Primary: Student in an Organized Health Care Education/Training Program

## 2024-03-20 DIAGNOSIS — I5020 Unspecified systolic (congestive) heart failure: Secondary | ICD-10-CM

## 2024-03-20 MED ORDER — FUROSEMIDE 20 MG PO TAB
20 mg | ORAL_TABLET | Freq: Every morning | ORAL | 1 refills | 90.00000 days | Status: AC
Start: 2024-03-20 — End: ?

## 2024-03-26 ENCOUNTER — Encounter
Admit: 2024-03-26 | Discharge: 2024-03-26 | Payer: MEDICARE | Primary: Student in an Organized Health Care Education/Training Program

## 2024-03-26 MED ORDER — FUROSEMIDE 20 MG PO TAB
20 mg | ORAL_TABLET | Freq: Every morning | ORAL | 1 refills | 90.00000 days | Status: AC
Start: 2024-03-26 — End: ?

## 2024-03-26 NOTE — Telephone Encounter
 I called and spoke to [place]. She will have Maciej hold the furosemide per Aleda Hurl Tibke's recommendation. I told her I would check back next week to see how he is doing. I asked her to call us  if he starts noticing any increase in edema or soa. She was agreeable to this plan.

## 2024-03-26 NOTE — Telephone Encounter
 Manuel Gould was not doing well since starting on Entresto. I called and spoke to [place]. She feels like [name] is becoming more fatigued and at times confused since starting the Entresto. His weight was down to 174lbs yesterday when he went for his MRI.  His furosemide was increased to daily from every other day d/t his recent lab results. Sheryle Donning said he was not eating very much but he is eating a little better now. She is watching his fluids like instructed. They do no monitor his weight or bp routinely. I had her check his bp today and it was 108/57 in his left arm and 102/58 in his right. I asked her to do an orthostatic bp on him and it was 116/94 sitting and dropped to 84/48 when he stood. She said he is needing to use a cane now d/t fatigue and weakness. I told her I would discuss this with Sharlotte Dean NP and get back to her.

## 2024-03-30 ENCOUNTER — Encounter
Admit: 2024-03-30 | Discharge: 2024-03-30 | Payer: MEDICARE | Primary: Student in an Organized Health Care Education/Training Program

## 2024-03-30 NOTE — Telephone Encounter
 Manuel Gould, pt's daughter lm she wanted to speak to Manuel Gould about her dads medication. I called Manuel Gould back and she was concerned over her dads elevated NT-pro BNP being so elevated. Manuel Gould saw her dad this past weekend and said he's still very weak and no eating much. She said he has an appt coming up with nephrology d/t his hyponatremia and they are trying to get him in with an eye doctor. Apparently Manuel Gould has been complaining of not being able to see. He does NOT routinely follow up with any eye doctor. Manuel Gould will call us  with any changes in her dads status or any concerns. I told her I was following up with her mom to see how Manuel Gould is doing after holding his furosemide. She was appreciative of the call back.

## 2024-04-02 ENCOUNTER — Encounter
Admit: 2024-04-02 | Discharge: 2024-04-02 | Payer: MEDICARE | Primary: Student in an Organized Health Care Education/Training Program

## 2024-04-02 NOTE — Telephone Encounter
===  View-only below this line===  ----- Message -----  From: Florian Craven, LPN  Sent: 3/80/7974   2:00 PM CDT  To: Cvm Nurse Gen Card Team Gold  Subject: how is  he doing holding his lasix

## 2024-04-03 ENCOUNTER — Encounter
Admit: 2024-04-03 | Discharge: 2024-04-03 | Payer: MEDICARE | Primary: Student in an Organized Health Care Education/Training Program

## 2024-04-06 ENCOUNTER — Encounter
Admit: 2024-04-06 | Discharge: 2024-04-06 | Payer: MEDICARE | Primary: Student in an Organized Health Care Education/Training Program

## 2024-04-13 ENCOUNTER — Encounter
Admit: 2024-04-13 | Discharge: 2024-04-13 | Payer: MEDICARE | Primary: Student in an Organized Health Care Education/Training Program

## 2024-04-16 ENCOUNTER — Encounter
Admit: 2024-04-16 | Discharge: 2024-04-16 | Payer: MEDICARE | Primary: Student in an Organized Health Care Education/Training Program

## 2024-04-17 ENCOUNTER — Encounter
Admit: 2024-04-17 | Discharge: 2024-04-17 | Payer: MEDICARE | Primary: Student in an Organized Health Care Education/Training Program

## 2024-04-17 MED FILL — ENTRESTO 24-26 MG PO TAB: 24-26 mg | ORAL | 30 days supply | Qty: 60 | Fill #1 | Status: AC

## 2024-04-27 ENCOUNTER — Encounter
Admit: 2024-04-27 | Discharge: 2024-04-27 | Payer: MEDICARE | Primary: Student in an Organized Health Care Education/Training Program

## 2024-05-04 ENCOUNTER — Encounter
Admit: 2024-05-04 | Discharge: 2024-05-04 | Payer: MEDICARE | Primary: Student in an Organized Health Care Education/Training Program

## 2024-05-17 ENCOUNTER — Encounter
Admit: 2024-05-17 | Discharge: 2024-05-17 | Payer: MEDICARE | Primary: Student in an Organized Health Care Education/Training Program

## 2024-05-19 ENCOUNTER — Encounter
Admit: 2024-05-19 | Discharge: 2024-05-19 | Payer: MEDICARE | Primary: Student in an Organized Health Care Education/Training Program

## 2024-05-20 ENCOUNTER — Encounter
Admit: 2024-05-20 | Discharge: 2024-05-20 | Payer: MEDICARE | Primary: Student in an Organized Health Care Education/Training Program

## 2024-05-20 MED FILL — ENTRESTO 24-26 MG PO TAB: 24-26 mg | ORAL | 30 days supply | Qty: 60 | Fill #2 | Status: AC

## 2024-06-08 ENCOUNTER — Encounter
Admit: 2024-06-08 | Discharge: 2024-06-08 | Payer: MEDICARE | Primary: Student in an Organized Health Care Education/Training Program

## 2024-06-16 ENCOUNTER — Encounter
Admit: 2024-06-16 | Discharge: 2024-06-16 | Payer: MEDICARE | Primary: Student in an Organized Health Care Education/Training Program

## 2024-06-18 ENCOUNTER — Encounter
Admit: 2024-06-18 | Discharge: 2024-06-18 | Payer: MEDICARE | Primary: Student in an Organized Health Care Education/Training Program

## 2024-06-18 MED FILL — ENTRESTO 24-26 MG PO TAB: 24-26 mg | ORAL | 30 days supply | Qty: 60 | Fill #3 | Status: AC

## 2024-06-23 ENCOUNTER — Encounter
Admit: 2024-06-23 | Discharge: 2024-06-23 | Payer: MEDICARE | Primary: Student in an Organized Health Care Education/Training Program

## 2024-06-23 ENCOUNTER — Ambulatory Visit
Admit: 2024-06-23 | Discharge: 2024-06-23 | Payer: MEDICARE | Primary: Student in an Organized Health Care Education/Training Program

## 2024-06-23 DIAGNOSIS — Z136 Encounter for screening for cardiovascular disorders: Principal | ICD-10-CM

## 2024-06-23 DIAGNOSIS — I517 Cardiomegaly: Secondary | ICD-10-CM

## 2024-06-23 DIAGNOSIS — R42 Dizziness and giddiness: Secondary | ICD-10-CM

## 2024-06-23 DIAGNOSIS — R002 Palpitations: Secondary | ICD-10-CM

## 2024-06-23 DIAGNOSIS — I255 Ischemic cardiomyopathy: Secondary | ICD-10-CM

## 2024-06-23 DIAGNOSIS — E785 Hyperlipidemia, unspecified: Secondary | ICD-10-CM

## 2024-06-23 DIAGNOSIS — I10 Essential (primary) hypertension: Secondary | ICD-10-CM

## 2024-06-23 DIAGNOSIS — E871 Hypo-osmolality and hyponatremia: Secondary | ICD-10-CM

## 2024-06-23 DIAGNOSIS — I5020 Unspecified systolic (congestive) heart failure: Secondary | ICD-10-CM

## 2024-06-23 MED ORDER — METOPROLOL SUCCINATE 25 MG PO TB24
25 mg | ORAL_TABLET | Freq: Every day | ORAL | 3 refills | 90.00000 days | Status: AC
Start: 2024-06-23 — End: ?

## 2024-06-25 ENCOUNTER — Inpatient Hospital Stay
Admit: 2024-06-25 | Discharge: 2024-06-27 | Disposition: A | Discharge status: Home / Self Care | Payer: MEDICARE | Source: Other Acute Inpatient Hospital | Attending: Cardiovascular Disease | Admitting: Cardiovascular Disease

## 2024-06-25 ENCOUNTER — Inpatient Hospital Stay
Admit: 2024-06-25 | Discharge: 2024-06-25 | Payer: MEDICARE | Primary: Student in an Organized Health Care Education/Training Program

## 2024-06-25 ENCOUNTER — Encounter
Admit: 2024-06-25 | Discharge: 2024-06-25 | Payer: MEDICARE | Primary: Student in an Organized Health Care Education/Training Program

## 2024-06-25 DIAGNOSIS — Z950 Presence of cardiac pacemaker: Secondary | ICD-10-CM

## 2024-06-25 DIAGNOSIS — R001 Bradycardia, unspecified: Principal | ICD-10-CM

## 2024-06-25 LAB — CBC AND DIFF
~~LOC~~ BKR HEMATOCRIT: 39 % — ABNORMAL LOW (ref 40.0–50.0)
~~LOC~~ BKR MCV: 99 fL (ref 80.0–100.0)
~~LOC~~ BKR RBC COUNT: 3.9 10*6/uL — ABNORMAL LOW (ref 4.40–5.50)
~~LOC~~ BKR RDW: 13 % (ref 11.0–15.0)
~~LOC~~ BKR WBC COUNT: 7.2 10*3/uL (ref 4.50–11.00)

## 2024-06-25 MED ORDER — ATROPINE 0.1 MG/ML IJ SYRG
1 mg | INTRAVENOUS | 0 refills | Status: DC | PRN
Start: 2024-06-25 — End: 2024-06-27

## 2024-06-25 MED ORDER — SACUBITRIL-VALSARTAN 24-26 MG PO TAB
1 | Freq: Two times a day (BID) | ORAL | 0 refills | Status: DC
Start: 2024-06-25 — End: 2024-06-26

## 2024-06-25 MED ORDER — ENOXAPARIN 40 MG/0.4 ML SC SYRG
40 mg | Freq: Every day | SUBCUTANEOUS | 0 refills | Status: DC
Start: 2024-06-25 — End: 2024-06-26

## 2024-06-25 MED ORDER — METOPROLOL SUCCINATE 25 MG PO TB24
25 mg | Freq: Every day | ORAL | 0 refills | Status: DC
Start: 2024-06-25 — End: 2024-06-27

## 2024-06-25 MED ORDER — ASPIRIN 81 MG PO CHEW
81 mg | Freq: Every day | ORAL | 0 refills | Status: DC
Start: 2024-06-25 — End: 2024-06-27
  Administered 2024-06-26 – 2024-06-27 (×2): 81 mg via ORAL

## 2024-06-25 MED ORDER — PANTOPRAZOLE 40 MG PO TBEC
40 mg | Freq: Every day | ORAL | 0 refills | Status: DC
Start: 2024-06-25 — End: 2024-06-27
  Administered 2024-06-26 – 2024-06-27 (×2): 40 mg via ORAL

## 2024-06-25 MED ORDER — POTASSIUM CHLORIDE 20 MEQ PO TBTQ
40 meq | Freq: Once | ORAL | 0 refills | Status: CP
Start: 2024-06-25 — End: ?
  Administered 2024-06-26: 01:00:00 40 meq via ORAL

## 2024-06-25 MED ORDER — MAGNESIUM SULFATE IN D5W 1 GRAM/100 ML IV PGBK
1 g | INTRAVENOUS | 0 refills | Status: CP
Start: 2024-06-25 — End: ?
  Administered 2024-06-26: 01:00:00 1 g via INTRAVENOUS

## 2024-06-25 NOTE — Telephone Encounter
 Patient's wife called stating that since lowing the dose of metoprolol  he has been bradycardiac and symptomatic with dizziness.  Patient's wife states his pulse in the 30's reports bp of 157/62 and 164/69.  Advised to go to er for evaluation and treatment.

## 2024-06-26 ENCOUNTER — Encounter
Admit: 2024-06-26 | Discharge: 2024-06-26 | Payer: MEDICARE | Primary: Student in an Organized Health Care Education/Training Program

## 2024-06-26 ENCOUNTER — Inpatient Hospital Stay
Admit: 2024-06-26 | Discharge: 2024-06-26 | Payer: MEDICARE | Primary: Student in an Organized Health Care Education/Training Program

## 2024-06-26 LAB — 2D + DOPPLER ECHO
AORTIC VALVE STROKE VOLUME INDEX: 35
ASCENDING AORTA: 3.3 cm
AV INDEX (NATIVE): 0.4
AV PEAK VELOCITY: 1.6 m/s
AV REGURGITATION PRESSURE HALF TIME: 803 ms
AV VENA CONTRACTA: 0.3 cm
BSA: 1.9 m2 (ref 150–400)
DOP CALC LVOT AREA: 3.4 cm2
DOP CALC LVOT DIAMETER: 2.1 cm
DOP CALC LVOT PEAK VEL VTI: 19 cm
DOP CALC LVOT PEAK VEL: 0.7 m/s
DOP CALC LVOT STROKE VOLUME: 67 cm3
ECHO EF: 45 %
EJECTION FRACTION: 69 % (ref 7–56)
FRACTIONAL SHORTENING: 40 % (ref 28–44)
INTERVENTRICULAR SEPTUM: 0.8 cm (ref 0.6–1)
LEFT ATRIUM INDEX: 38 mL/m2 (ref 16–34)
LEFT ATRIUM SIZE: 4.3 cm (ref 3–4)
LEFT ATRIUM VOLUME: 75 mL (ref 18–58)
LEFT INTERNAL DIMENSION IN SYSTOLE: 3.9 cm — ABNORMAL LOW (ref 2.5–4)
LEFT VENTRICLE DIASTOLIC VOLUME INDEX: 95 mL/m2 (ref 34–74)
LEFT VENTRICLE DIASTOLIC VOLUME: 185 mL (ref 62–150)
LEFT VENTRICLE MASS INDEX: 104 g/m2 (ref 49–115)
LEFT VENTRICLE SYSTOLIC VOLUME INDEX: 33 mL/m2 (ref 11–31)
LEFT VENTRICLE SYSTOLIC VOLUME: 64 mL (ref 21–61)
LEFT VENTRICULAR INTERNAL DIMENSION IN DIASTOLE: 6.6 cm (ref 4.2–5.8)
LEFT VENTRICULAR MASS: 204 g (ref 88–224)
MV VENA CONTRACTA: 0.3 cm
PISA RADIUS: 0.4 cm
PISA VN NYQUIST: 0.3 m/s
POSTERIOR WALL: 0.7 cm (ref 0.6–1)
RA PRESSURE: 3
RELATIVE WALL THICKNESS: 0.2 (ref <=0.42)
RIGHT ATRIAL AREA: 20 cm2 (ref <18)
RIGHT HEART SYSTOLIC MMODE TAPSE: 1.8 cm (ref >1.7)
RIGHT HEART SYSTOLIC TDI S': 0.1 m/s
RIGHT VENTRICULAR BASAL DIAMETER: 3.8 cm (ref 2.5–4.1)
RIGHT VENTRICULAR MID DIAMETER: 3.1 cm (ref 1.9–3.5)
RV SYSTOLIC PRESSURE: 55
SINUS: 3.4 cm (ref 2.8–4)
TR PEAK VELOCITY: 3.7 m/s
TV REST PULMONARY ARTERY PRESSURE: 58 mmHg

## 2024-06-26 LAB — EP DEVICE
EP A CAPTURE MS: 0.4
EP A CAPTURE V: 0.7
EP A LEAD OHMS: 532
EP A SENSE MV: 0.8
EP AO PULSE WIDTH: 0.4
EP AO VOLTAGE: 3.5
EP ATRIAL LEAD MODEL#: 383
EP HIGH A RATE DETECT: 150
EP HIGH V RATE DETECT: 150
EP LOWER RATE LIMIT: 60
EP MODE SWITCH (BPM): 150
EP PACE AV DELAY: 150
EP RV CAPTURE MS: 0.4
EP RV CAPTURE V: 0.7
EP RV LEAD MODEL#: 383
EP RV LEAD OHMS: 680
EP RV PULSE WIDTH: 0.4
EP RV SENSE MV: 9.1
EP RV VOLTAGE: 3.5
EP SENSE AV DELAY: 130
EP SENSOR RATE LIMIT: 120
EP UPPER RATE LIMIT: 130
EP VT MONITOR: 150

## 2024-06-26 LAB — ECG 12-LEAD
P AXIS: -10 degrees
P-R INTERVAL: 80 ms
Q-T INTERVAL: 646 ms
QRS DURATION: 136 ms
QTC CALCULATION (BAZETT): 485 ms
R AXIS: -46 degrees
T AXIS: -73 degrees
VENTRICULAR RATE: 34 {beats}/min

## 2024-06-26 LAB — HIGH SENSITIVITY TROPONIN I 2 HOUR
~~LOC~~ BKR HIGH SENSITIVITY TROPONIN I 2 HOUR: 20 ng/L — ABNORMAL HIGH (ref <20.0)
~~LOC~~ BKR HIGH SENSITIVITY TROPONIN I DELTA VALUE: 4.4

## 2024-06-26 LAB — HIGH SENSITIVITY TROPONIN I 4 HR
~~LOC~~ BKR HI SEN TNI DELTA 4-2: 0
~~LOC~~ BKR HIGH SENSITIVITY TROPONIN I 4 HOUR: 19 ng/L (ref <20.0)

## 2024-06-26 LAB — PROTIME INR (PT): ~~LOC~~ BKR PROTIME: 12 s (ref 9.9–14.2)

## 2024-06-26 LAB — TSH WITH FREE T4 REFLEX: ~~LOC~~ BKR TSH: 1.8 [IU]/mL — ABNORMAL HIGH (ref 0.35–5.00)

## 2024-06-26 LAB — COMPREHENSIVE METABOLIC PANEL: ~~LOC~~ BKR CHLORIDE: 100 mmol/L — ABNORMAL HIGH (ref 98–110)

## 2024-06-26 MED ORDER — ISOSORBIDE MONONITRATE 60 MG PO TB24
60 mg | Freq: Every morning | ORAL | 0 refills | Status: DC
Start: 2024-06-26 — End: 2024-06-27
  Administered 2024-06-26 – 2024-06-27 (×2): 60 mg via ORAL

## 2024-06-26 MED ORDER — ACETAMINOPHEN 325 MG PO TAB
650 mg | ORAL | 0 refills | Status: DC | PRN
Start: 2024-06-26 — End: 2024-06-27
  Administered 2024-06-27: 02:00:00 650 mg via ORAL

## 2024-06-26 MED ORDER — SODIUM CHLORIDE 0.9 % IJ SOLN
10 mL | Freq: Once | INTRAVENOUS | 0 refills | Status: CP
Start: 2024-06-26 — End: ?
  Administered 2024-06-26: 13:00:00 10 mL via INTRAVENOUS

## 2024-06-26 MED ORDER — PERFLUTREN LIPID MICROSPHERES 1.1 MG/ML IV SUSP
1-10 mL | Freq: Once | INTRAVENOUS | 0 refills | Status: CP | PRN
Start: 2024-06-26 — End: ?
  Administered 2024-06-26: 13:00:00 5 mL via INTRAVENOUS

## 2024-06-26 MED ORDER — CEFAZOLIN 1GM NS 500ML IRR BAG (OR)(OSM)
0 refills | Status: DC
Start: 2024-06-26 — End: 2024-06-26
  Administered 2024-06-26 (×2): 500 mL

## 2024-06-26 MED ORDER — BUPIVACAINE (PF) 0.25 % (2.5 MG/ML) IJ SOLN
0 refills | Status: DC
Start: 2024-06-26 — End: 2024-06-26

## 2024-06-26 MED ORDER — CEFAZOLIN 2 GRAM IV SOLR
2 g | Freq: Once | INTRAVENOUS | 0 refills | Status: CP
Start: 2024-06-26 — End: ?
  Administered 2024-06-26: 17:00:00 2 g via INTRAVENOUS

## 2024-06-26 MED ORDER — LIDOCAINE (PF) 10 MG/ML (1 %) IJ SOLN
0 refills | Status: DC
Start: 2024-06-26 — End: 2024-06-26

## 2024-06-26 MED ORDER — SACUBITRIL-VALSARTAN 24-26 MG PO TAB
1 | Freq: Two times a day (BID) | ORAL | 0 refills | Status: DC
Start: 2024-06-26 — End: 2024-06-27
  Administered 2024-06-26 – 2024-06-27 (×2): 1 via ORAL

## 2024-06-26 MED ORDER — BUDESONIDE 0.5 MG/2 ML IN NBSP
.5 mg | Freq: Two times a day (BID) | RESPIRATORY_TRACT | 0 refills | Status: DC
Start: 2024-06-26 — End: 2024-06-27
  Administered 2024-06-27: 16:00:00 0.5 mg via RESPIRATORY_TRACT

## 2024-06-26 MED ORDER — SODIUM CHLORIDE 0.9% IV SOLP
INTRAVENOUS | 0 refills | Status: DC
Start: 2024-06-26 — End: 2024-06-27

## 2024-06-26 MED ORDER — FENTANYL CITRATE (PF) 50 MCG/ML IJ SOLN
0 refills | Status: DC
Start: 2024-06-26 — End: 2024-06-26

## 2024-06-26 MED ORDER — MIDAZOLAM 1 MG/ML IJ SOLN
0 refills | Status: DC
Start: 2024-06-26 — End: 2024-06-26

## 2024-06-26 MED ORDER — LIDOCAINE (PF) 10 MG/ML (1 %) IJ SOLN
.2 mL | INTRAMUSCULAR | 0 refills | Status: DC | PRN
Start: 2024-06-26 — End: 2024-06-27

## 2024-06-26 MED ADMIN — SODIUM CHLORIDE 0.9% IV SOLP [27838]: 250 mL | INTRAVENOUS | @ 01:00:00 | Stop: 2024-06-26 | NDC 00338004902

## 2024-06-26 NOTE — Progress Notes
 Chaplain Note:    Patient is Catholic and attends at [employer] in [place].  Chaplain visited to offer prayer, support and sacramental care.   Patient's wife and daughter were present at bedside.  Patient appeared calm and hopeful but concerned about his health and asked for prayer and sacramental care for healing.  Patient shared about his concerns but hopeful.  Patient has a great support from his family.  Chaplain offered active listening, support, prayer and provided the Sacrament of the Sick / Last Rites for peace, hope and healing.  Patient appreciated for visit.  Continue available for support and prayer.     The spiritual care team is available as needed, 24/7, through the campus switchboard [phone]). For a response within 24 hours, please submit an order in [facility] for a chaplain consult.

## 2024-06-26 NOTE — Progress Notes
 Patient transferred back to [facility]. Patient awake and alert. On room air. No complaints of pain or nausea. Right chest site remains clean, dry and intact. No evidence of a hematoma. Bedside check done with Maurilio, RN.

## 2024-06-26 NOTE — Progress Notes
 RT Adult Assessment Note    NAME:Manuel Gould             MRN: [id number]             DOB:1935-04-12          AGE: 89 y.o.  ADMISSION DATE: 06/25/2024             DAYS ADMITTED: LOS: 1 day    Additional Comments:  Impressions of the patient: Patient in bed on 1L no respiratory distress noted  Intervention(s)/outcome(s): Assessed pt, pt unable to actively participate in assessment information gathered through the pts chart and family member in the room, pt takes nebulized Pulmicort  BID, no OSA dx, and no home O2 use, OPEP ordered BID per CXR results   Patient education that was completed: none  Recommendations to the care team: none    Vital Signs:  Pulse: 60  RR: 18 PER MINUTE  SpO2: 96 %  O2 Device: Nasal cannula  Liter Flow: 1 Lpm  Right Base Breath Sounds: Decreased  Left Base Breath Sounds: Decreased     Comments:

## 2024-06-26 NOTE — Consults
 Electrophysiology Consult Note:    Admission Date: 06/25/2024  Date of Consultation:  06/26/2024  LOS: 1 day  Requesting Physician: Dorn DELENA Meissner, MD  Consulting Physician:  Dr. Derick Bear  Code Status: Full Code    Reason for Consultation  Opinion and recommendations regarding bradycardia    Assessment:  Sinus bradycardia  - rates in the 30's persistently  - PTA Toprol  XL 50 mg daily reduced to 25 mg daily on 9/9 and DC'd on admit  - symptomatic with dizziness/lightheadedness    Mild ischemic cardiomyopathy  - EF 40-45% 01/2024  - EF 45% 06/26/2024    CAD   - prior CABG (2002)  - NSTEMI (2020)    RBBB  1st degree AV block  Prior CVa  HTN  HLD  COPD  Hx spontaneous pneumo requiring chest tube 09/2023    Recommendations:  Will proceed with dual chamber right sided (LBBa) PPM placement today with moderate sedation.  Orders placed.  Please keep NPO.    Izetta Ni, NP-C (pgr [phone])  Heart Rhythm Management (pgr [phone])      History of Present Illness:  Manuel Gould is a 89 y.o. male patient with a hx of CAD with prior CABG and PCI, mild ICM, RBBB, 1st degree AV block, prior CVA, HTN, HLD and COPD.  Patient has had increasing dizziness/lightheadedness.  His Toprol  was reduced from 50 mg daily to 25 mg daily earlier this week in CV office when heart rates were in the 50's.  Patient experienced worsening s/s at home with heart rates in the 30's.  He presented to OSH and transferred to [facility].      Past Medical History:  Past Medical History:    Asthma    CAD (coronary artery disease)    CAD (coronary artery disease), native coronary artery    Chronic lung disease    COPD (chronic obstructive pulmonary disease) (CMS-HCC)    Essential hypertension    History of MI (myocardial infarction)    HTN (hypertension)    Pneumonia    S/P CABG x 3     Social History:  Social History     Socioeconomic History    Marital status: Married   Tobacco Use    Smoking status: Former     Current packs/day: 0.00     Types: Cigarettes     Quit date: 10/15/1958     Years since quitting: 65.7     Passive exposure: Past    Smokeless tobacco: Never   Vaping Use    Vaping status: Never Used   Substance and Sexual Activity    Alcohol use: No    Drug use: No     Surgical History:  Surgical History:   Procedure Laterality Date    HX CORONARY ARTERY BYPASS GRAFT  2003    x3 vessels    Left Heart Catheterization With Ventriculogram Left 11/21/2015    Performed by Erling Locus, MD at [facility] CATH LAB    Coronary Angiography N/A 11/21/2015    Performed by Erling Locus, MD at [facility] CATH LAB    Possible Percutaneous Coronary Intervention N/A 11/21/2015    Performed by Erling Locus, MD at [facility] CATH LAB    ANGIOGRAPHY CORONARY ARTERY AND ARTERIAL/VENOUS GRAFTS WITH LEFT HEART CATHETERIZATION N/A 01/23/2019    Performed by Erling Locus, MD at [facility] CATH LAB    PERCUTANEOUS CORONARY STENT PLACEMENT WITH ANGIOPLASTY N/A 01/23/2019    Performed by Erling Locus, MD at [facility] CATH LAB  Family History:  Family History   Problem Relation Name Age of Onset    Stroke Father      Coronary Artery Disease Father      Sudden Cardiac Death Father       Medications:  aspirin  chewable tablet 81 mg, 81 mg, Oral, QDAY  [Held by Provider] enoxaparin  (LOVENOX ) syringe 40 mg, 40 mg, Subcutaneous, QDAY(21)  [Held by Provider] metoprolol  succinate XL (TOPROL  XL) tablet 25 mg, 25 mg, Oral, QDAY  pantoprazole  DR (PROTONIX ) tablet 40 mg, 40 mg, Oral, QDAY  [Held by Provider] sacubitriL -valsartan  (ENTRESTO ) 24-26 mg tablet 1 tablet, 1 tablet, Oral, BID      atropine  PRN    Allergies:  Allergies[1]    Review of Systems:  Const: denies recent fever, chills, or weight loss  Eyes:  denies changes in vision  Ears/Nose/Throat:  denies congestion or rhinorrhea  Cardiovascular:  denies chest pain or palpitations  Respiratory: denies dyspnea, orthopnea, PND or productive cough    Gastrointestinal:  denies N/V/D  Musculoskeletal:  denies muscle aches or pains  Skin: denies known rashes, lesions or sores  Neurologic:  + dizziness/lightheadedness                         Vital Signs: Most Recent                 Vital Signs: 24 Hour Range   BP: 166/52 (09/12 0814)  Temp: 36.6 ?C (97.8 ?F) (09/12 9185)  Pulse: 35 (09/12 0957)  Respirations: 16 PER MINUTE (09/12 0814)  SpO2: 95 % (09/12 0814)  O2 Device: None (Room air) (09/12 0814)  Height: 172.7 cm (5' 8) (09/12 0741) BP: (142-168)/(43-56)   Temp:  [36.3 ?C (97.4 ?F)-36.9 ?C (98.4 ?F)]   Pulse:  [35-41]   Respirations:  [16 PER MINUTE-18 PER MINUTE]   SpO2:  [93 %-97 %]   O2 Device: None (Room air)     Vitals:    06/25/24 1802 06/26/24 0519 06/26/24 0741   Weight: 79.8 kg (176 lb) 79.7 kg (175 lb 12.8 oz) 79.4 kg (175 lb)       Intake/Output Summary (Last 24 hours) at 06/26/2024 1027  Last data filed at 06/26/2024 9076  Gross per 24 hour   Intake 450 ml   Output 1150 ml   Net -700 ml           Physical Exam:  GEN: well appearing 89 y.o. male who appears stated age and is no acute distress  HEAD: normocephalic  EYES: sclera non icteric  MOUTH: PMMM  LUNGS: resp even and nonlabored   EXTR: no C/C/E 2+ DP pulses  SKIN: warm and dry  NEUR: A&Ox3  PSYCH: calm and cooperative    Labs:  Hematology:    Lab Results   Component Value Date    HGB 13.3 06/26/2024    HGB 15.8 09/17/2022    HCT 38.8 06/26/2024    HCT 44.6 09/17/2022    PLTCT 155 06/26/2024    PLTCT 185 09/17/2022    WBC 6.70 06/26/2024    WBC 7.84 09/17/2022    NEUT 69.2 06/25/2024    NEUT 72 06/17/2021    ANC 5.00 06/25/2024    ANC 5.69 06/17/2021    ALC 1.60 06/25/2024    ALC 1.29 06/17/2021    MONA 7.1 06/25/2024    MONA 9 06/17/2021    AMC 0.50 06/25/2024    AMC 0.72 06/17/2021    EOSA 0.5  06/25/2024    EOSA 1 06/17/2021    ABC 0.10 06/25/2024    ABC 0.05 06/17/2021    MCV 101.3 06/26/2024    MCV 92.9 09/17/2022    MCH 34.6 06/26/2024    MCH 32.9 09/17/2022    MCHC 34.2 06/26/2024    MCHC 35.4 09/17/2022    MPV 8.6 06/26/2024    MPV 9.9 09/17/2022    RDW 13.6 06/26/2024    RDW 41.8 09/17/2022   , Coagulation:    Lab Results   Component Value Date    PT 12.8 06/25/2024    PT 12.5 06/16/2021    PTT 32.3 06/25/2024    PTT 96.6 06/18/2021    INR 1.1 06/25/2024    INR 1.1 06/16/2021   , General Chemistry:    Lab Results   Component Value Date    NA 140 06/26/2024    NA 140 06/08/2024    K 4.0 06/26/2024    K 4.1 06/08/2024    CL 103 06/26/2024    CL 99 06/08/2024    CO2 27 06/26/2024    CO2 28.0 06/08/2024    GAP 10 06/26/2024    GAP 13 06/08/2024    BUN 11 06/26/2024    BUN 7.2 06/08/2024    CR 0.83 06/26/2024    CR 0.79 06/08/2024    GLU 106 06/26/2024    GLU 113 06/08/2024    CA 9.0 06/26/2024    CA 9.1 06/08/2024    ALBUMIN 3.7 06/25/2024    ALBUMIN 3.3 06/08/2024    LACTIC 0.9 06/16/2021    MG 2.1 06/26/2024    MG 2.0 06/20/2021    TOTBILI 0.9 06/25/2024    TOTBILI 0.74 06/08/2024    PO4 2.5 06/20/2021   , Endocrine:  Lab Results   Component Value Date    TSH 1.88 06/25/2024       ECG:  ECG shows sinus bradycardia with rates in the 30's      Telemetry:  Sinus bradycardia with rates in the 30's       Izetta Ni, NP-C [phone]                     [1]   Allergies  Allergen Reactions    Simvastatin ANAPHYLAXIS

## 2024-06-26 NOTE — Procedures
 Cardiac Electrophysiology Brief Post-Procedure Note    Attending: Derick Bear, MD    EP FELLOW: Coralee Danish, MBBS    Preoperative diagnosis: 2: 1 AV block    Postoperative diagnosis: 2:1 AV block    Procedure(s) Performed: Dual-chamber pacemaker implantation     Procedure Description: Successful Dual-chamber pacemaker implantation ([employer])    Procedure Findings: Adequate testing of device leads  -Please review full operative report for full details of the procedure  -Right sided device implant    Anesthesia: Moderate sedation via RN     Estimated blood loss: 20 mL    Complications: None     Specimens removed: None     Closing: Continuous 2-0 Vicryl in 2 layers, continuous 4-0 Monocryl,Dermabond then dressing      Recommendations:  - Pain control as needed  - Device interrogation, ECG, and Chest X-Ray in the morning  - No post-procedure antibiotics   - No heparin  or LMWH products for 72 hours post-procedure.  - Incision Check in one week   - Follow-up: EP APP in 3 months

## 2024-06-26 NOTE — Progress Notes
 HC5 END OF SHIFT/PLAN OF CARE NURSING NOTE   Nursing Shift: Day Shift 0700-1900    Acute events, nursing interventions, & communication with providers: Pt went down for PPM procedure around 1200 and returned to unit around 1600.    Upon return to unit, pt A&Ox4, calm, and cooperative.     1722: BP 181/73, MAP 105. Pt asymptomatic. Dr. Stephanie Saris notified. Per Dr. Kosuri, okay to give Entresto  now and recheck BP in an hour.    BP recheck: 144/60, MAP 85. Dr. Saris notified, no further orders.      Patient Goal(s)  Patient will Maintain stable fluid volume with clear breath sounds and vital signs within normal limit by the end of next shift.        Patient will  Verbalize readiness for discharge by discharge.   Admission Weight: Weight: 79.8 kg (176 lb)    Last 3 Weights:   Vitals:    06/25/24 1802 06/26/24 0519 06/26/24 0741   Weight: 79.8 kg (176 lb) 79.7 kg (175 lb 12.8 oz) 79.4 kg (175 lb)     Weight Change: Weight trend stable    Intake/Output Summary (Last 24 hours) at 06/26/2024 1828  Last data filed at 06/26/2024 1416  Gross per 24 hour   Intake 830 ml   Output 870 ml   Net -40 ml     Last Bowel Movement Date:  (pta)    Fluid Restriction? No   Quality/Safety    Total Fall Risk Score: 18   Risk for Injury related to falls: Age > 85 years and Surgery/procedure within the last 24 hours  Fall Risk Category:   History of More Than One Fall Within 6 Months Before Admission: Yes  Elimination, Bowel and Urine: N/A  Interventions: N/A - Does not score for risk in this category  Medications: Sedated procedure within past 24 hours  Interventions: Use of gait belt , Stay within arm's reach during toileting/showering (i.e., dizziness, orthostasis) , Educate patient on medication side effects, and Bed/chair alarm (i.e., change in mental status)   Patient Care Equipment: One present  Interventions: N/A - Does not score as risk in this category  Mobility: 2 - Assistance required  Interventions: Assist x1, Gait belt in use when ambulating, Use of additional staff for handling patient equipment, and Elimination equipment at bedside (urinal or commode)   Cognition: 0 - No cognition issues  Interventions: N/A - Does not score as risk in this category    Other safety precautions in place: N/A    Restraints:  No      Patient Education  This RN provided education to Patient, Family, and Significant Other today. The following education topics were reviewed:  Quality/Safety Education:   Fall risk and Pain scale  Medication Education:   First dose medication(s) and Medication management (Indication, adverse effects, monitoring, etc)  Education provided on the following medication(s): aspirin , imdur , protonix , entresto   Cardiac - Specific Education:   Post cardiac procedure care: PPM placement and Cardiac diet/nutrition  General Education:   Bowel/Urinary elimination, Diet/nutrition, Discharge planning, and Mobility/Activity intolerance    The following teaching method(s) were used: Verbal  Response to learning: [employer]

## 2024-06-26 NOTE — Case Management (ED)
 Case Management Admission Assessment    NAME:Manuel Gould                          MRN: [id number]             DOB:1935-02-15          AGE: 89 y.o.  ADMISSION DATE: 06/25/2024             DAYS ADMITTED: LOS: 1 day      Today?s Date: 06/26/2024    Source of Information: This CM met with pt's wife, Niels for assessment on this date.  Provided contact information and explanation of SW/NCM roles.  Reviewed [facility], Preparing for Discharge, and [facility].  Provided opportunity for questions and discussion. Pt/family encouraged to contact Case Management team with questions and concerns during hospitalization and until patient is able to transition back to the patient's primary care physician.  -Patient and his wife [place] in [place].  Patient is independent with a SPC.  No history of HH.       Plan  Plan: Assist PRN with SW/NCM Services:  CM team will follow for d/c needs.      Patient Address/Phone  [address]  [place] [address]  [phone] (home)     Emergency Contact  Extended Emergency Contact Information  Primary Emergency Contact: Handa,Carol  Address: [address]           [place] [address]  Home Phone: [phone]  Mobile Phone: [phone]  Relation: Spouse  Secondary Emergency Contact: [facility] Phone: [phone]  Relation: Daughter  Preferred language: ENGLISH  Interpreter needed? No    Forensic scientist: Yes, patient has a healthcare directive  Type of Healthcare Directive: Healthcare directive, Durable power of attorney for healthcare  Location of Healthcare Directive: Patient does not have it with him/her  Would patient like to fill out a (a new) Healthcare Directive?: No, patient declined  Psych Advance Directive (Psych unit only): No, patient does not have a Social research officer, government  Does the Patient Need Case Management to Arrange Discharge Transport? (ex: facility, ambulance, wheelchair/stretcher, Medicaid, cab, other): No  Will the Patient Use Family Transport?: Yes  Transportation Name, Phone and Availability #1: kids    Expected Discharge Date  06/29/2024     Living Situation Prior to Admission  Living Arrangements  Type of Residence: Home, independent  Living Arrangements: Spouse/significant other  Financial risk analyst / Tub: Tub/Shower Unit  How many levels in the residence?: 1  Can patient live on one level if needed?: Yes  Does residence have entry and/or inside stairs?: Yes (3)  Assistance needed prior to admit or anticipated on discharge: No  Level of Function   Prior level of function: Independent  Cognitive Abilities   Cognitive Abilities: Alert and Oriented, Engages in problem solving and planning, Participates in Radio producer Resources  Coverage  Primary Insurance: Medicare  Secondary Insurance: Medicare Supplement  Additional Coverage: None  Medication Coverage    Medication Coverage: Medicare Part D  Medicare Part D Plan: BCBS  Have you experienced a noticeable increase in your copay costs recently?: No  Are current medications affordable?: Yes  Do You Use a Co-Pay Card or a Medication Assistance Program to Help Manage Medication Costs?: No  Do You Manage Your Own Medications?: Yes  Source of Income  Source Of Income: SSI  Financial Assistance Needed?  no    Psychosocial Needs  Mental Health  Mental Health History: No  Substance Use History  Substance Use History Screen: No  Other  no    Current/Previous Services  PCP  Veria Pac, [phone], [phone]  Pharmacy    [facility] [phone] [facility] [place] - [address]  [address]  [place] [address]  Phone: [phone] Fax: [phone]    [facility]  [address]  [place] [address]  Phone: [phone] Fax: [phone]    [facility] [address] [place] - [address]  [place] [address]  Phone: [phone] Fax: [phone]    Durable Medical Equipment   Durable Medical Equipment at home: Nebulizer, Single [facility]  Receiving home health: No  Hemodialysis or Peritoneal Dialysis  Undergoing hemodialysis or peritoneal dialysis: No  Tube/Enteral Feeds  Receive tube/enteral feeds: No  Infusion  Receive infusions: No  Private Duty  Private duty help used: No  Home and Community Based Services  Home and community based services: No  Ryan White  Ryan White: N/A  Hospice  Hospice: No  Outpatient Therapy  PT: No  OT: No  SLP: No  Skilled Nursing Facility/Nursing Home  SNF: No  NH: No  Inpatient Rehab  IPR: No  Long-Term Acute Care Hospital  LTACH: No  Acute Hospital Stay  Acute Hospital Stay: In the past  Was patient's stay within the last 30 days?: No      [place] Lennie PEAK, CCM  Integrated Case Manager  [phone]

## 2024-06-26 NOTE — Progress Notes
 HC5 END OF SHIFT/PLAN OF CARE NURSING NOTE   Nursing Shift: Night Shift 1900-0700    Acute events, nursing interventions, & communication with providers: PRN tylenol  order obtained for soreness at incision; medication given/effective.  Pt needing reminding often of limited use RUE d/t PPM placement; sling remains in place as reminder. Pt has been NPO since MN.       Patient Goal(s)  Patient will Be able to ambulate without breathing difficulty by the end of next shift.        Patient will  Report progressive increase in activity tolerance by discharge.   Admission Weight: Weight: 79.8 kg (176 lb)    Last 3 Weights:   Vitals:    06/26/24 0519 06/26/24 0741 06/27/24 0524   Weight: 79.7 kg (175 lb 12.8 oz) 79.4 kg (175 lb) 80.4 kg (177 lb 4 oz)     Weight Change: Weight trend stable    Intake/Output Summary (Last 24 hours) at 06/27/2024 0658  Last data filed at 06/27/2024 0500  Gross per 24 hour   Intake 680 ml   Output 1020 ml   Net -340 ml     Last Bowel Movement Date:  (PTA)    Fluid Restriction? No   Quality/Safety    Total Fall Risk Score: 18   Risk for Injury related to falls: Age > 85 years  Fall Risk Category:   History of More Than One Fall Within 6 Months Before Admission: Yes  Elimination, Bowel and Urine: N/A  Interventions: N/A - Does not score for risk in this category  Medications: Sedated procedure within past 24 hours  Interventions: Stay within arm's reach during toileting/showering (i.e., dizziness, orthostasis)  and Bed/chair alarm (i.e., change in mental status)   Patient Care Equipment: One present  Interventions: Needs assistance with patient care equipment when ambulating  Mobility: 2 - Assistance required  Interventions: Assist x1  Cognition: 0 - No cognition issues  Interventions: N/A - Does not score as risk in this category    Other safety precautions in place: N/A    Restraints:  No      Patient Education  This RN provided education to Patient today. The following education topics were reviewed:  Quality/Safety Education:   Fall risk  Medication Education:   First dose medication(s)  Education provided on the following medication(s): Magnesium  and potassium  Cardiac - Specific Education:   Cardiac diagnosis specific education: bradycardia  General Education:   Tests/Procedures: PPM    The following teaching method(s) were used: Verbal  Response to learning: [employer] Understanding  Reinforcement needed: Pt needing reinforcement on limited use of RUE s/p PPM placement.

## 2024-06-26 NOTE — Progress Notes
 Pre-Procedure Airway Assessment     Planned Procedure: PPM implant    Time of last oral intake: Yesterday    Assessment: No abnormalities and Dysmorphic facial features    Note: If any factors present an anesthesia consult should be considered    Malampatti: II    ASA Class: ASA II (A normal patient with mild systemic disease)    Physician has discussed risks and alternatives of this type sedation and above planned procedure(s) with: Patient    Allergies:   Allergies[1]     Current Medications: Reviewed    Appropriate labs/diagnostic tests: Reviewed    Have the patient or anyone in the patient's family ever had a history of sedation/anesthesia complications? No        [1]   Allergies  Allergen Reactions    Simvastatin ANAPHYLAXIS

## 2024-06-26 NOTE — Progress Notes
 HC5 END OF SHIFT/PLAN OF CARE NURSING NOTE   Nursing Shift: Night Shift 1900-0700    Acute events, nursing interventions, & communication with providers: Pt has cho ordered for this AM. NPO for possible PPM today. Pt HR sustaining in the 30s overnight. Sob with exertion but otherwise asymptomatic.       Patient Goal(s)  Patient will Be able to ambulate without breathing difficulty by the end of next shift.        Patient will  Report progressive increase in activity tolerance by discharge.   Admission Weight: Weight: 79.8 kg (176 lb)    Last 3 Weights:   Vitals:    06/25/24 1802 06/26/24 0519   Weight: 79.8 kg (176 lb) 79.7 kg (175 lb 12.8 oz)     Weight Change: Weight trend stable    Intake/Output Summary (Last 24 hours) at 06/26/2024 0654  Last data filed at 06/26/2024 0519  Gross per 24 hour   Intake 450 ml   Output 950 ml   Net -500 ml     Last Bowel Movement Date:  (PTA)    Fluid Restriction? No   Quality/Safety    Total Fall Risk Score: 14   Risk for Injury related to falls: Age > 85 years  Fall Risk Category:   History of More Than One Fall Within 6 Months Before Admission: Yes  Elimination, Bowel and Urine: N/A  Interventions: N/A - Does not score for risk in this category  Medications: On 2 or more high fall risk drugs  Interventions: Stay within arm's reach during toileting/showering (i.e., dizziness, orthostasis)  and Bed/chair alarm (i.e., change in mental status)   Patient Care Equipment: One present  Interventions: Needs assistance with patient care equipment when ambulating  Mobility: 0 - No mobility issues  Interventions: Assist x1  Cognition: 0 - No cognition issues  Interventions: N/A - Does not score as risk in this category    Other safety precautions in place: N/A    Restraints:  No      Patient Education  This RN provided education to Patient today. The following education topics were reviewed:  Quality/Safety Education:   Fall risk  Medication Education:   First dose medication(s)  Education provided on the following medication(s): Magnesium  and potassium  Cardiac - Specific Education:   Cardiac diagnosis specific education: bradycardia  General Education:   Tests/Procedures: PPM    The following teaching method(s) were used: Verbal  Response to learning: [employer]

## 2024-06-27 ENCOUNTER — Inpatient Hospital Stay
Admit: 2024-06-27 | Discharge: 2024-06-27 | Payer: MEDICARE | Primary: Student in an Organized Health Care Education/Training Program

## 2024-06-27 DIAGNOSIS — I255 Ischemic cardiomyopathy: Secondary | ICD-10-CM

## 2024-06-27 DIAGNOSIS — Z8241 Family history of sudden cardiac death: Secondary | ICD-10-CM

## 2024-06-27 DIAGNOSIS — I441 Atrioventricular block, second degree: Secondary | ICD-10-CM

## 2024-06-27 DIAGNOSIS — E871 Hypo-osmolality and hyponatremia: Secondary | ICD-10-CM

## 2024-06-27 DIAGNOSIS — Z79899 Other long term (current) drug therapy: Secondary | ICD-10-CM

## 2024-06-27 DIAGNOSIS — Z7982 Long term (current) use of aspirin: Secondary | ICD-10-CM

## 2024-06-27 DIAGNOSIS — Z955 Presence of coronary angioplasty implant and graft: Secondary | ICD-10-CM

## 2024-06-27 DIAGNOSIS — K8689 Other specified diseases of pancreas: Secondary | ICD-10-CM

## 2024-06-27 DIAGNOSIS — I5022 Chronic systolic (congestive) heart failure: Secondary | ICD-10-CM

## 2024-06-27 DIAGNOSIS — I2510 Atherosclerotic heart disease of native coronary artery without angina pectoris: Secondary | ICD-10-CM

## 2024-06-27 DIAGNOSIS — Z888 Allergy status to other drugs, medicaments and biological substances status: Secondary | ICD-10-CM

## 2024-06-27 DIAGNOSIS — J439 Emphysema, unspecified: Secondary | ICD-10-CM

## 2024-06-27 DIAGNOSIS — Z7951 Long term (current) use of inhaled steroids: Secondary | ICD-10-CM

## 2024-06-27 DIAGNOSIS — J4489 Other specified chronic obstructive pulmonary disease (CMS-HCC): Secondary | ICD-10-CM

## 2024-06-27 DIAGNOSIS — Z951 Presence of aortocoronary bypass graft: Secondary | ICD-10-CM

## 2024-06-27 DIAGNOSIS — Z87891 Personal history of nicotine dependence: Secondary | ICD-10-CM

## 2024-06-27 DIAGNOSIS — I452 Bifascicular block: Secondary | ICD-10-CM

## 2024-06-27 DIAGNOSIS — I110 Hypertensive heart disease with heart failure: Secondary | ICD-10-CM

## 2024-06-27 DIAGNOSIS — E785 Hyperlipidemia, unspecified: Secondary | ICD-10-CM

## 2024-06-27 DIAGNOSIS — Z8673 Personal history of transient ischemic attack (TIA), and cerebral infarction without residual deficits: Secondary | ICD-10-CM

## 2024-06-27 DIAGNOSIS — Z8249 Family history of ischemic heart disease and other diseases of the circulatory system: Secondary | ICD-10-CM

## 2024-06-27 DIAGNOSIS — I252 Old myocardial infarction: Secondary | ICD-10-CM

## 2024-06-27 LAB — ECG 12-LEAD
P AXIS: 29 degrees — ABNORMAL LOW (ref 3.5–5.0)
P-R INTERVAL: 192 ms (ref 0.40–1.00)
Q-T INTERVAL: 454 ms — ABNORMAL LOW (ref 6.0–8.0)
QRS DURATION: 160 ms (ref 8.5–10.6)
QTC CALCULATION (BAZETT): 552 ms (ref 0.2–1.3)
R AXIS: -89 degrees (ref 25–110)
T AXIS: 176 degrees (ref 7–40)
VENTRICULAR RATE: 89 {beats}/min (ref 7–25)

## 2024-06-27 MED ORDER — MAGNESIUM SULFATE IN D5W 1 GRAM/100 ML IV PGBK
1 g | INTRAVENOUS | 0 refills | Status: CP
Start: 2024-06-27 — End: ?
  Administered 2024-06-27: 14:00:00 1 g via INTRAVENOUS

## 2024-06-27 MED ORDER — METOPROLOL SUCCINATE 25 MG PO TB24
25 mg | Freq: Every day | ORAL | 0 refills | Status: DC
Start: 2024-06-27 — End: 2024-06-27
  Administered 2024-06-27: 19:00:00 25 mg via ORAL

## 2024-06-27 NOTE — Progress Notes
 OCCUPATIONAL THERAPY  ASSESSMENT NOTE      Name: Manuel Gould   MRN: [id number]     DOB: Jul 19, 1935      Age: 89 y.o.  Admission Date: 06/25/2024     LOS: 2 days     Date of Service: 06/27/2024      Mobility  Patient Turn/Position: Chair  Mobility Level [facility] Highest Level of Mobility (JH-HLM): Walk 250 feet or more  Distance Walked (feet): 360 ft  Level of Assistance: Assist X1  Assistive Device: None  Activity Limited By: Weakness    Subjective  Significant Hospital Events: [name] is a 89 y.o. male patient with a hx of CAD with prior CABG and PCI, mild ICM, RBBB, 1st degree AV block, prior CVA, HTN, HLD and COPD.  Patient has had increasing dizziness/lightheadedness.  His Toprol  was reduced from 50 mg daily to 25 mg daily earlier this week in CV office when heart rates were in the 50's.  Patient experienced worsening s/s at home with heart rates in the 30's.  He presented to OSH and transferred to [facility].  Mental / Cognitive: Alert;Oriented  Pain: No complaint of pain  Persons Present: Spouse    Home Living Situation  Lives With: Spouse/significant other  Type of Home: House  Entry Stairs: 3  In-Home Stairs: No stairs  Bathroom Setup: Tub/shower unit  Patient Owned Equipment: None    Prior Level of Function  Level Of Independence: Independent with ADL and community mobility without device  History of Falls in Past 3 Months: Yes    ADL Mobility  Bed Mobility: Supine to Sit: Standby assist  Bed Mobility Comments: Pt was supine in bed at OT arrival and left seated in recliner with all needs met and RN aware.  Transfer Type: Sit to stand  Transfer: Assistance Level: To/from;Bed;Bedside chair;Minimal assist  Transfer: Assistive Device: None  Transfer: Type of Assistance: For safety considerations  End of Activity Status: Up in chair;Instructed patient to request assist with mobility;Instructed patient to use call light;Nursing notified  Sitting Balance: Static sitting balance;Dynamic sitting balance;Standby assist  Standing Balance: Static standing balance;Dynamic standing balance;Minimal assist  Gait Distance: 360 feet  Gait: Assistance Level: Minimal assist  Gait: Assistive Device: None    Activity Tolerance  Endurance: 3/5 Tolerates 25-30 Minutes Exercise w/Multiple Rests    Cognition  Overall Cognitive Status: WFL to Adequately Complete Self Care Tasks Safely    ROM  R UE ROM: WFL   R UE ROM Method: Active  L UE ROM: WFL   L UE ROM Method: Active    Edema  RUE Edema: No Significant Edema  LUE Edema: No Significant Edema    Strength / Tone  Strength Position Assessed: Seated  R UE Strength: WFL  L UE Strength: WFL    Education  Persons Educated: Patient  Barriers To Learning: None Noted  Interventions: Repetition of Instructions  Patient Response: Verbalized Understanding  Topics: Role of OT, Goals for Therapy;Home safety  Goal Formulation: With Patient    Assessment  Assessment: Decreased ADL Status;Decreased Safe/Judg during ADL;Decreased Endurance;Decreased High-Level ADLs;Decreased Self-Care Trans  Prognosis: Good;w/Cont OT s/p Acute Discharge  Goal Formulation: Patient  Comments: Pt tolerated therapy well this date. Pt is limited by lack of endurance, weakness and lines to complete high level ADLs and functional transfers. Pt would benefit from continued OT to address deficits described.    AM-PAC 6 Clicks Daily Activity Inpatient  Putting on and taking off regular lower  body clothes: A Little  Bathing (Including washing, rinsing, drying): A Little  Toileting, which includes using toilet, bedpan, or urinal: A Little  Putting on and taking off regular upper body clothing: None  Taking care of personal grooming such as brushing teeth: None  Eating meals: None  Daily Activity Raw Score: 21  Standardized (T-scale) Score: 44.27    Plan  OT Frequency: 5x/week  OT Plan for Next Visit: edurance, dressing, toileting    ADL Goals  Patient Will Perform All ADL's: w/ Stand By Assist  Patient Will Perform Toileting: w/ Stand By Assist    Functional Transfer Goals  Pt Will Perform All Functional Transfers: w/ Stand By Assist    OT Discharge Recommendations  Recommendation: Home with intermittent supervision/assistance  Patient Currently Requires Physical Assist With: All mobility;All home functioning ADLs  Patient Currently Requires Supervision For: Mobility  Patient Currently Requires Equipment: None    Therapist: Carlyon Finn, OT  Date: 06/27/2024

## 2024-06-27 NOTE — Progress Notes
 EP Note:  s/p permanent pacemaker    Awake and alert.  Denies complaints overnight.  Dermabond remains in place with edges well approximated.  Minimal pocket edema with edges of device easily palpated.     Pt denies chest pain or shortness of breath.     CXR reveals appropriate lead placement without evidence of pneumothorax.    Device interrogation reveals normal pacing, sensing, and threshold testing.    No heparin  or LMWH products for 72 hours post-procedure.      Incision check scheduled in [place]. [place].  EP follow-up appt request pended.    EP will sign off.  Please contact us  with any concerns.      Deward Gauss, APRN-C (pgr [phone])  Heart Rhythm Management (pgr [phone])

## 2024-06-27 NOTE — Progress Notes
 CLINICAL NUTRITION                                                        Clinical Nutrition Initial Assessment    Name: Manuel Gould   MRN: [id number]     DOB: April 20, 1935      Age: 89 y.o.  Admission Date: 06/25/2024     LOS: 2 days     Date of Service: 06/27/2024        Recommendation:  Continue Cardiac diet.   Encourage 3 meals per day, snacks as needed      Comments:  Pt is a 89 y.o. M with PMH for first-degree AV block, HFrEF, CABG x3, NSTEMI in 2020 s/p DES x 2, HTN, HLD and advanced emphysema who was admitted with symptomatic bradycardia. He had symptoms of dizziness, presyncope and intermittent vision changes with exertion for several months. RD met with pt at bedside due to potentially at risk status because of reported weight loss. Pt reports appetite is good now and PTA and was eat 2-3 meals per day. Pt denies following any specific diet at home. Endorses weight loss, approximately 30# since January. Weight history lacking in this timeframe. Per care everywhere weight was 193 one year ago (7.3% weight loss - insignificant for timeframe). Pt believes he has gained about 10# back. Pt was reporting low appetite in this timeframe. Per EMR, cardiology suspected chronic pancreatitis, pt denied any nausea, vomiting, or diarrhea. Pt reports constipation but is managed by medication. This pt is not meeting malnutrition criteria at this time but continues at nutrition risk due to reported weight loss.    Nutrition Assessment of Patient:  Desired Weight: 74.2 kg  BMI (Calculated): 26.61;      Pertinent Allergies/Intolerances: NKFA     Oral Diet Order: Cardiac;       Current Energy Intake: Adequate           Weight Used for Calculation: 74.2 kg  Estimated Calorie Needs: 1855-2226 kcal (25-30 kcal/kg DBW  Estimated Protein Needs: 89-111 g (1.2-1.5 kcal/kg DBW)    Malnutrition Assessment:   Does not meet criteria    Nutrition Focused Physical Exam:  Loss of Subcutaneous Fat: No;  ;    Muscle Wasting: No;  ; Physical Assessment:  RLE Edema: Non-pitting  LLE Edema: Non-pitting  Pedal Edema: Pitting 1+     Pressure Injury: None     Comment: Last BM 9/13    Nutrition Diagnosis:  Unintended weight loss  Etiology: poor appetite  Signs & Symptoms: Pt report and 7.3% weight loss x 1 year       Intervention / Plan:  Will monitor PO intakes, weight changes, GI function, meds, and labs.     [facility]   Clinical Dietitian, MS, RD, LD   Available on Voalte

## 2024-06-27 NOTE — Progress Notes
 PHYSICAL THERAPY  DISCHARGE NOTE      Name: Manuel Gould   MRN: [id number]     DOB: [birth year]      Age: 89 y.o.  Admission Date: 06/25/2024     LOS: 2 days     Date of Service: 06/27/2024    Based on discussion with OT, patient's current level of function suggests that patient will progress with one discipline. PT will sign off at this time. Please re-consult if a change in functional status occurs.     Therapist: Alan Pina, PT  Date: 06/27/2024

## 2024-06-27 NOTE — Progress Notes
 Manuel Gould discharged on 06/27/2024.   Manuel Gould  Discharge instructions reviewed with patient and family.  Valuables returned:    .  Home medications:    .  Functional assessment at discharge complete: Yes .      PIV removed. AVS reviewed. All questions and concerns addressed. Patient left unit via wheelchair.

## 2024-06-28 ENCOUNTER — Encounter
Admit: 2024-06-28 | Discharge: 2024-06-28 | Payer: MEDICARE | Primary: Student in an Organized Health Care Education/Training Program

## 2024-06-29 ENCOUNTER — Encounter
Admit: 2024-06-29 | Discharge: 2024-06-29 | Payer: MEDICARE | Primary: Student in an Organized Health Care Education/Training Program

## 2024-06-29 ENCOUNTER — Ambulatory Visit
Admit: 2024-06-29 | Discharge: 2024-06-29 | Payer: MEDICARE | Primary: Student in an Organized Health Care Education/Training Program

## 2024-06-29 LAB — DEVICE EVALUATION - PPM (PERMANENT PACEMAKER)
EP ATRIAL LEAD MODEL#: 383
EP RV LEAD MODEL#: 383

## 2024-06-29 NOTE — Progress Notes
 New patient was enrolled into [employer] remote monitoring. New patient welcome letter and agreement were sent. Remote monitoring schedule was entered into patient chart for future device checks. Next remote device check is scheduled for 09/26/24.  BC

## 2024-06-30 ENCOUNTER — Encounter
Admit: 2024-06-30 | Discharge: 2024-06-30 | Payer: MEDICARE | Primary: Student in an Organized Health Care Education/Training Program

## 2024-07-02 ENCOUNTER — Encounter
Admit: 2024-07-02 | Discharge: 2024-07-02 | Payer: MEDICARE | Primary: Student in an Organized Health Care Education/Training Program

## 2024-07-08 ENCOUNTER — Encounter
Admit: 2024-07-08 | Discharge: 2024-07-08 | Payer: MEDICARE | Primary: Student in an Organized Health Care Education/Training Program

## 2024-07-08 NOTE — Progress Notes
 Patient brought in box with heart monitor. She said it was ordered 2 days before he went to the ER and had PPM placed. Looks like it was ordered for bradycardia and dizziness. Let her know she can mail it back and I would notify the monitor team.

## 2024-07-08 NOTE — Progress Notes
Right prepectoral incision is clean, dry, well approximated, and healing without evidence of drainage or discharge.  Pt reports no adverse symptoms. Incision care, including signs and symptoms of infection, reviewed(as below). Pt verbalized understanding and will remain in phone contact.    You may shower once dressing removed at follow up appointment; however avoid direct contact with the incision (allow the water to hit the back of your shoulder rather than directly on the incision).    Do not submerge incision in tub, pool, hot tub, or lake for 4 weeks.    Unless your incision is bleeding or draining, keep it open to air.    Avoid applying deodorants, powders, creams, lotions, etc. to your incision for 4 weeks.    Usually there are no stitches to be removed. Steri-strips will begin to fall off in 10-14 days. If they remain after 2 weeks, gently remove them when they are damp after a shower.    Your incision should gradually look better each day. Please notify our office immediately if you notice any of the following:   -an increase in swelling or redness   -any drainage   -increasing pain at the incision site  -fever over 100 degrees

## 2024-07-13 ENCOUNTER — Encounter
Admit: 2024-07-13 | Discharge: 2024-07-13 | Payer: MEDICARE | Primary: Student in an Organized Health Care Education/Training Program

## 2024-07-14 ENCOUNTER — Encounter
Admit: 2024-07-14 | Discharge: 2024-07-14 | Payer: MEDICARE | Primary: Student in an Organized Health Care Education/Training Program

## 2024-07-14 MED FILL — ENTRESTO 24-26 MG PO TAB: 24-26 mg | ORAL | 30 days supply | Qty: 60 | Fill #4 | Status: AC

## 2024-07-21 ENCOUNTER — Encounter
Admit: 2024-07-21 | Discharge: 2024-07-21 | Payer: MEDICARE | Primary: Student in an Organized Health Care Education/Training Program

## 2024-08-03 ENCOUNTER — Encounter
Admit: 2024-08-03 | Discharge: 2024-08-03 | Payer: MEDICARE | Primary: Student in an Organized Health Care Education/Training Program

## 2024-08-03 ENCOUNTER — Ambulatory Visit
Admit: 2024-08-03 | Discharge: 2024-08-04 | Payer: MEDICARE | Primary: Student in an Organized Health Care Education/Training Program

## 2024-08-03 DIAGNOSIS — I5020 Unspecified systolic (congestive) heart failure: Secondary | ICD-10-CM

## 2024-08-03 DIAGNOSIS — I440 Atrioventricular block, first degree: Principal | ICD-10-CM

## 2024-08-03 DIAGNOSIS — L03116 Cellulitis of left lower limb: Secondary | ICD-10-CM

## 2024-08-03 DIAGNOSIS — Z950 Presence of cardiac pacemaker: Secondary | ICD-10-CM

## 2024-08-03 DIAGNOSIS — I255 Ischemic cardiomyopathy: Secondary | ICD-10-CM

## 2024-08-03 DIAGNOSIS — R001 Bradycardia, unspecified: Secondary | ICD-10-CM

## 2024-08-03 DIAGNOSIS — Z951 Presence of aortocoronary bypass graft: Secondary | ICD-10-CM

## 2024-08-03 DIAGNOSIS — T07XXXA Unspecified multiple injuries, initial encounter: Secondary | ICD-10-CM

## 2024-08-03 MED ORDER — POTASSIUM CHLORIDE 10 MEQ PO TBER
10 meq | ORAL_TABLET | Freq: Every day | ORAL | 1 refills | 30.00000 days | Status: AC
Start: 2024-08-03 — End: ?

## 2024-08-03 MED ORDER — CEPHALEXIN 500 MG PO CAP
500 mg | ORAL_CAPSULE | Freq: Four times a day (QID) | ORAL | 0 refills | 7.00000 days | Status: AC
Start: 2024-08-03 — End: ?

## 2024-08-03 MED ORDER — FUROSEMIDE 20 MG PO TAB
20 mg | ORAL_TABLET | Freq: Every morning | ORAL | 1 refills | 90.00000 days | Status: AC
Start: 2024-08-03 — End: ?

## 2024-08-03 NOTE — Progress Notes
 Date of Service: 08/03/2024    Manuel Gould is a 89 y.o. male.       HPI     Mr. Manuel Gould was seen in office today for posthospital follow-up.  He is accompanied today by his wife, Manuel Gould who is also a patient in our office and helps the patient manage his medications.  He is a 89 year old male followed in our office by Dr.Swathi Kovelamudi.  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     He was hospitalized at [facility] in December 2024 with spontaneous right sided pneumothorax requiring chest tube placement.      He had hospital admission in April 2025 at [facility] in [place] for worsening dyspnea and was diuresed.  Echocardiogram showed  EF of 40 to 45% and abnormal septal motion.     The patient was last seen in our office on 06/23/2024 by Dr. Elon with patient complains of significant dizziness over the past few weeks with worsening symptoms especially noticeable while attempting to work in the yard.  Also complaining of being dyspneic EKG in the office that day was interpreted as sinus bradycardia with heart rate 54 bpm and first-degree AV block.  Metoprolol  dose was decreased to 25 mg daily and a 2-week long-term cardiac monitor was placed to assess his ongoing dizziness.  His blood pressure was elevated in the office but reported home normal readings.  He was asked to keep a blood pressure log to bring to his follow-up visit. Continue guideline-directed medical therapies for his coronary disease and ICM with aspirin , isosorbide  mononitrate plan is after he has his monitor and would adjust his guideline directed medical therapy.  Plan was made for patient to follow-up in the office in 3 months.  And Entresto .    Patient had hospital admission at [facility] 9/11 - 06/27/2024 for progressive dizziness with exertion shortly after he saw Dr. Delma in the office.  Metoprolol  dose had been decreased at his office visit and 50 mg to 25 mg daily and he was sent home with a Zio patch.  However he subsequently was admitted on 9/11 due to worsening symptomatic bradycardia with heart rate in 30s.  On admission he was bradycardic in the mid 30s with EKG showing 2: 1 AV block with RBBB.  Echo 9/12 showed mild reduced LV systolic function with EF 45%, hypokinesis/akinesis of the inferior and inferolateral wall segments.  Mildly dilated LV with LVIDD 6.6 cm.  Mild biatrial dilatation.  Normal CVP.  Elevated PASP 58 mmHg.   mitral annular dilatation and leaflet thickening with mild MR.  Mild to moderate TR.  Mild aortic insufficiency.  No pericardial effusion.  EP consulted and he underwent dual-chamber right sided (LBBa) PPM placement on 06/26/2024.  Postprocedure he reported improvement in symptoms.  Telemetry/EKG showed ventricular paced rhythm with heart rate approximately 60 to 80 bpm.  Blood pressure was elevated following the procedure but improved with resuming PTA Entresto .  PTA Imdur  and aspirin  were continued throughout admission  and PTA metoprolol  25 mg was restarted prior to discharge.  Scheduled for follow-up nurse visit for pacemaker incision check on 9/24 and outpatient visit with cardiology in [place] on 08/03/2024    Today the patient reports increasing lower extremity edema for about the past 1 to 2 weeks.  He has a prescription for furosemide  20 mg that he uses as needed but has been taking it daily for the past 3 days.  He notices some slight improvement in his leg swelling.  Continues to have some significant swelling in his right ankle.  Patient reports that he fractured that ankle several years ago.  He is having some seeping of serous fluid from his legs and has open sores lateral right lower extremity just above the ankle and on the left shin.  They are applying some sort of Gelweave tape to the areas covered by nonstick dressing.  Wife reports he has gone to wound care in the past at the hospital in [place], [place]  but is not sure if they still have wound care available there.  Patient is not homebound and would not qualify for home health services at this time.  He notes some dyspnea on exertion which is at his usual baseline but tells me overall his breathing is good and he is able to get outside and do more activities including some lawn work.  Tells me that he has recently plug the garden has been working walnuts without becoming short of breath.  He denies having chest pain, exertional chest pain, shortness of breath at rest, PND and orthopnea.  Denies bloating.  Denies tachypalpitations, dizziness, lightheadedness, near-syncope and syncope.  Denies TIA or CVA type symptoms.  He does note recent occasional dry cough but is not coughing today.  He denies fever, chills, night sweats and wheezing.  Denies recent illness.  He recently had cataracts removed from both his eyes.  Left eye was done on 07/16/2024 in the right eye last week on 07/30/2024.  He has not yet followed up with Dr.  Veria care physician since he was discharged from the hospital on 06/27/2024.  States compliance taking his medications.  Medication record reviewed.Currently takes Imdur  60 mg daily, aspirin  81 mg daily, metoprolol  XL 25 mg daily and Entresto  24-26 mg twice daily.  Has prescription for furosemide  20 mg as needed.  States he has taken a dose of furosemide  for the last 3 days in a row.  His blood pressure today is 118/68, pulse 1 bpm, weight 178 pounds.    Cardiovascular Studies  -Last  echocardiogram 06/26/2024:    Profound sinus bradycardia throughout exam with first-degree AV block with heart rates in low 30s bpm    1. Left ventricular chamber is moderately dilated with LVIDD of 6.6 cm and LVEDVi of 95 mL/m? with normal wall thickness.    2. Hypokinesis/akinesis of the inferior and inferolateral wall segments as depicted in the diagram below.  Mildly reduced LV systolic function.  LVEF=45%.  3. Normal right ventricular size with low-normal global right ventricular contractility.  TAPSE = 18 mm.  4. Mild biatrial dilatation. Normal central venous pressure (0-5 mmHg).  5. Estimated pulmonary artery systolic pressure is 58 mmHg.  6. Mitral annular dilatation and leaflet thickening.  Mild MR.  Mild to moderate TR.  7. Focally thickened aortic leaflets without stenosis.  Mild AI.  8. Aortic root and proximal ascending aorta are normal in size.  9. No pericardial effusion.     Compared with prior study dated  01/24/2024, global LV and regional LV systolic function appear grossly unchanged however profound bradycardia is a new finding on today's study.  There is some interval increase in Severity of the tricuspid regurgitation is well with evidence of elevated pulmonary artery systolic pressure on today's study.    Assessment:  # HFrEF due to ischemic cardiomyopathy  - Echo  (TTE) 06/26/2024: EF 45%, LVIDD 6.6 cm.  Mild MR, mild-moderate TR, mild AI.  PASP 58 mmHg  - NYHA class 2, stage C  -Diuretic: Furosemide  20 mg as needed for weight gain  - GDMT: Entresto  24-26 mg twice daily metoprolol  XL 25 mg daily.  - MRA: None  - Labs acceptable and last assessed on 06/27/2024 with sodium 139, potassium 4.0, creatinine 0.83, hemoglobin 12.7, magnesium  1.8.  Renal function appears normal with baseline creatinine~0.80.    # Bradycardia  -Recent heart rate in the 30s with progressive dizziness  - Admitted 9/11 - 06/27/2024 [place], EKG showed sinus bradycardia with 2: 1 AV block and right bundle branch block.  EP consulted.  - S/p permanent pacemaker implantation 06/26/2024.    # Permanent pacemaker device functioning normally  -S/p implantation of PPM for sinus bradycardia and 2: 1 AV block and RBBB 06/26/2024 (dual-chamber right sided LBBa  pacemaker)  -Last remote device check 06/27/2024 1 day post implant shows normal device function.  Atrial pacing 22.3%, RV pacing 99.96%, AT/AF burden 0%.    #Coronary artery disease  - S/p CABG X 3 v in 2002 (LIMA to LAD, radial artery to circumflex and SVG to PDA  - S/p PCI with stent x 2 to SVG to RCA in 2020  - Continue aspirin , isosorbide  mononitrate and beta-blocker  -Intolerant to statins.    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    #COPD  - Followed by pulmonary in [place], [place]     #Emphysema    #Spontaneous right sided pneumothorax 09/2024, hospitalized at [facility] requiring chest tube placement.    #Statin intolerance    Plan:  -Change furosemide  10 mg to once daily in the morning  - Start oral potassium 10 mEq daily with food  -Consider starting spironolactone at future date, will await lab results.  - Start cephalexin 500 mg 4 times daily for 7 days, starting today 10/20 through 08/09/2024 dose.  Take with food.  Advised patient to contact PCP, Dr. Veria or office if he develops GI upset, nausea, vomiting or diarrhea with the cephalexin as we may need to discontinue it early.  -Advised to follow-up with PCP within the next 1-2 weeks.  -Advised to follow-up with PCP immediately if the area on her shin starts to look more angry, red or if he develops fever.  - Referral to wound care at [facility] in [place] to treat open sores on left shin.  -Check lab including BMP, NT proBNP and magnesium  in 1 week, 08/10/2024.  -We will continue to check the patient's pacemaker remotely every quarter.  - Next in office device evaluation in  2 months with EP APP visit.  - Will  plan for  routine quarterly remote device monitoring and annual interrogations  in our office.  -Check daily weight and blood pressure and keep diary to bring to each visit  - Instructed to call office if his weight goes up 3 pounds overnight or 5 pounds in a week or if he has signs of fluid retention  - Instructed to call our office if his blood pressures regularly above 140/90  - Cardiac healthy low-salt, Mediterranean style diet.  - Discussed importance of following a 2 g sodium restricted/low-salt diet  - Increase walking/physical activity  -Suggest either light weight compression socks (10-20 mmHg) or Tubigrip's to wear during daytime, off at bedtime.  -Follow up with other specialists as scheduled    Follow-up: Clive Chancy, EP APP as scheduled on 09/21/24 with pacemaker check same day.. Dr. Kovelamudi  as scheduled on 09/24/24 in our [place] clinic.  Patient encouraged to contact our office if he has problems prior to next visit.     I have educated the patient on the plan of care.  The patient expressed verbal understanding and agreement with the plan.  Instructions are outlined in the after visit summary document.     Thank you for the opportunity to participate in the care of your patient.  If you have any questions or concerns please do not hesitate to contact us .     I spent 55 minutes on this encounter including time for chart review, including outpatient and hospiatl records,  physical exam, assessment, formation of treatment plan, medical decision making, follow up and documentation. I reviewed and discussed his/her heart disease, medications, medication instructions, medication interactions, blood pressure monitoring, blood pressure goals, diet/sodium restriction, weight reduction, exercise, weight reduction, exercise, reviewed s/s of fluid retention/ volume overload, reviewed HF zones,  follow up plan.  We reviewed the above treatment plan, went over risks/benefits/alternatives to the therapies, and all questions were answered to his/her satisfaction.  I reviewed the EMR including but not limited to provider notes, most recent laboratory, radiologic and cardiovascular test results.                             MCM  Vitals:    08/03/24 1306 08/03/24 1308   BP:  118/68   BP Source:  Arm, Left Upper   Pulse:  71   SpO2:  99%   O2 Device:  None (Room air)   PainSc:  Zero   Weight: 80.8 kg (178 lb 3.2 oz) 80.8 kg (178 lb 3.2 oz)   Height:  172.7 cm (5' 8)     Body mass index is 27.1 kg/m?.     Current Medications (including today's revisions)   Aspirin  81 mg Tab Take one tablet by mouth daily.    budesonide  respule (PULMICORT ) 0.5 mg/2 mL nebulizer solution Inhale 2 mL solution by nebulizer as directed twice daily.    cephalexin (KEFLEX) 500 mg capsule Take one capsule by mouth four times daily. Take for 7 full days starting today 08/03/24.    finasteride  (PROSCAR ) 5 mg tablet Take one tablet by mouth daily.    furosemide  (LASIX ) 20 mg tablet Take one tablet by mouth every morning.    isosorbide  mononitrate ER (IMDUR ) 60 mg ER tablet Take one tablet by mouth every morning.    metoprolol  succinate XL (TOPROL  XL) 25 mg extended release tablet Take one tablet by mouth daily.    nitroglycerin  (NITROSTAT ) 0.4 mg tablet Place one tablet under tongue every 5 minutes as needed for Chest Pain. Max of  3 tablets, call 911.    pantoprazole  DR (PROTONIX ) 40 mg tablet Take one tablet by mouth daily.    potassium chloride  (K-TAB ) 10 mEq tablet Take one tablet by mouth daily with food.    sacubitriL -valsartan  (ENTRESTO ) 24-26 mg tablet Take one tablet by mouth twice daily.                 Review of Systems   Constitutional: Negative. HENT: Negative.     Eyes: Negative.    Cardiovascular:  Positive for dyspnea on exertion and leg swelling.   Respiratory:  Positive for cough and shortness of breath.    Endocrine: Negative.    Skin: Negative.    Musculoskeletal: Negative.    Gastrointestinal: Negative.    Genitourinary: Negative.    Neurological: Negative.    Psychiatric/Behavioral: Negative.         Physical Exam  Vital signs were reviewed.   General Appearance: appears well nourished, appears relaxed, in no acute distress,, SpO2 99%, BMI 27.09 kg/m?[name]  Skin: warm, moist, intact, no rash or lesions, no xanthomas, scars to surgical scars on nose from skin cancer resections  HEENT: unremarkable, pupils equal and round, no scleral icterus, conjunctivae and lids normal  Lips & Mouth: no pallor or cyanosis  Neck Veins: JVP normal, JVP not elevated above the sternal notch, neck veins are flat, neck veins are not distended   Carotid Arteries: normal carotid upstroke bilaterally, no bruits bilaterally  Chest Inspection: chest is normal in appearance  Auscultation/Percussion/Effort: lungs clear to auscultation, no rales, rhonchi, or wheezing, respirations even and unlabored, no respiratory distress  Cardiac Rhythm: regular rhythm and normal rate   Cardiac Auscultation: normal S1 & S2, no S3 or S4, no rub   Murmurs: no cardiac murmurs   Extremities: 2+ lower extremity edema bilaterally, with 3+ edema right ankle, 2 open sores, 1 lateral distal right foreleg and 1 on the left shin, both are seeping serous fluid,  skin around LLE open sore is erythematous, 2+ symmetric distal pulses   Abdominal Exam: soft, non-tender, non-distended, no obvious masses, bowel sounds normal, no guarding, no abdominal bruits  Liver & Spleen: no organomegaly   Neurologic Exam: grossly intact, alert, moves all extremities equally coordination normal  Orientation: oriented to time, person and place, clear historian  Gait: normal, steady, walks without assistance  Language & Memory: speech clear, patient responsive, seems to comprehend information  Psych: Behavior, thought content, mood, affect normal           Cardiovascular Health Factors  Vitals BP Readings from Last 3 Encounters:   08/03/24 118/68   06/27/24 136/66   06/23/24 (!) 173/92     Wt Readings from Last 3 Encounters:   08/03/24 80.8 kg (178 lb 3.2 oz)   06/27/24 80.4 kg (177 lb 4 oz)   06/23/24 81.6 kg (180 lb)     BMI Readings from Last 3 Encounters:   08/03/24 27.10 kg/m?   06/27/24 26.95 kg/m?   06/23/24 27.37 kg/m?      Smoking Tobacco Use History[1]   Lipid Profile Cholesterol   Date Value Ref Range Status   06/26/2024 124 <200 mg/dL Final     HDL   Date Value Ref Range Status   06/26/2024 44 >40 mg/dL Final     LDL   Date Value Ref Range Status   06/26/2024 69 <100 mg/dL Final     Triglycerides   Date Value Ref Range Status   06/26/2024 69 <150 mg/dL Final  Blood Sugar Hemoglobin A1C   Date Value Ref Range Status   06/26/2024 5.5 4.0 - 5.7 % Final     Comment:     The ADA recommends that most patients with type 1 and type 2 diabetes maintain an A1c level <7%.     Glucose   Date Value Ref Range Status   06/27/2024 103 (H) 70 - 100 mg/dL Final   90/87/7974 893 (H) 70 - 100 mg/dL Final   90/88/7974 891 (H) 70 - 100 mg/dL Final        Problems Addressed Today  Encounter Diagnoses   Name Primary?    HFrEF (heart failure with reduced ejection fraction) (CMS-HCC) Yes    First degree AV block     Ischemic cardiomyopathy     S/P CABG x 3     Presence of permanent cardiac pacemaker     Bradycardia     Cellulitis of left lower extremity     Wounds, multiple        Past Medical History  Patient Active Problem List    Diagnosis Date Noted    Presence of permanent cardiac pacemaker 08/03/2024    Cellulitis of left lower extremity 08/03/2024    Bradycardia 06/25/2024    Hyponatremia 03/06/2024    Ischemic cardiomyopathy 02/09/2024    Hyponatremia 02/09/2024    Elevated d-dimer 06/21/2021    Closed fracture of distal end of right fibula with routine healing 06/21/2021    HFrEF (heart failure with reduced ejection fraction) (CMS-HCC) 06/21/2021    First degree AV block 06/21/2021    Dyslipidemia 06/21/2021    Statin intolerance 06/21/2021    Pulmonary nodules 06/21/2021    Normocytic anemia 06/21/2021    B12 deficiency 06/21/2021    Pre-diabetes 06/21/2021    History of basal cell carcinoma 01/23/2019    BPH (benign prostatic hyperplasia) 01/23/2019    COPD (chronic obstructive pulmonary disease) (CMS-HCC)     Ventricular hypertrophy 02/23/2009    History of MI (myocardial infarction) 02/23/2009    Asthma 02/23/2009    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 June 2003 with LIMA graft to the LAD, a radial artery to the circumflex and a vein graft to the PDA.    B. Previous heart cath at [facility] with unsuccessful attempts to revascularize the artery, but there was no evidence of ischemia on perfusion exam in the right coronary artery territory.    C. Stress study from May 2005 showed an inferolateral perfusion defect that was not present on the more recent study      here in February 2010.    D. Stress study from February 2010 which was performed with regadenoson  stress with an ejection fraction of 62 percent had normal left ventricular volumes.  The perfusion images did not show ischemia.  There was evidence of some LAD injury, but no high risk indicators were noted.  E. 11/21/15 [facility]: Patent grafts.      Essential hypertension              [1]   Social History  Tobacco Use   Smoking Status Former    Current packs/day: 0.00    Types: Cigarettes    Quit date: 10/15/1958    Years since quitting: 65.8    Passive exposure: Past   Smokeless Tobacco Never

## 2024-08-10 LAB — BASIC METABOLIC PANEL
ANION GAP: 12
BLD UREA NITROGEN: 11
CALCIUM: 9.2
CHLORIDE: 100
CO2: 29
CREATININE: 0.8
GFR ESTIMATED: 83
GLUCOSE,PANEL: 91
POTASSIUM: 4.2
SODIUM: 141

## 2024-08-10 LAB — NT-PRO-BNP: NT-PRO-BNP: 663 — ABNORMAL HIGH (ref 0–450)

## 2024-08-12 ENCOUNTER — Encounter
Admit: 2024-08-12 | Discharge: 2024-08-12 | Payer: MEDICARE | Primary: Student in an Organized Health Care Education/Training Program

## 2024-08-12 DIAGNOSIS — I255 Ischemic cardiomyopathy: Principal | ICD-10-CM

## 2024-08-12 DIAGNOSIS — I5020 Unspecified systolic (congestive) heart failure: Secondary | ICD-10-CM

## 2024-08-14 ENCOUNTER — Encounter
Admit: 2024-08-14 | Discharge: 2024-08-14 | Payer: MEDICARE | Primary: Student in an Organized Health Care Education/Training Program

## 2024-08-20 ENCOUNTER — Encounter
Admit: 2024-08-20 | Discharge: 2024-08-20 | Payer: MEDICARE | Primary: Student in an Organized Health Care Education/Training Program

## 2024-08-21 ENCOUNTER — Encounter
Admit: 2024-08-21 | Discharge: 2024-08-21 | Payer: MEDICARE | Primary: Student in an Organized Health Care Education/Training Program

## 2024-08-21 MED FILL — ENTRESTO 24-26 MG PO TAB: 24-26 mg | ORAL | 30 days supply | Qty: 60 | Fill #5 | Status: AC

## 2024-08-26 LAB — BASIC METABOLIC PANEL
ANION GAP: 8
BLD UREA NITROGEN: 17
CALCIUM: 9
CHLORIDE: 94 — ABNORMAL LOW (ref 98–107)
CO2: 32 — ABNORMAL HIGH (ref 22–30)
CREATININE: 1
GFR ESTIMATED: 72
GLUCOSE,PANEL: 115 — ABNORMAL HIGH (ref 74–100)
POTASSIUM: 4.2
SODIUM: 134 — ABNORMAL LOW (ref 137–145)

## 2024-08-28 ENCOUNTER — Encounter
Admit: 2024-08-28 | Discharge: 2024-08-28 | Payer: MEDICARE | Primary: Student in an Organized Health Care Education/Training Program

## 2024-08-28 DIAGNOSIS — I10 Essential (primary) hypertension: Principal | ICD-10-CM

## 2024-08-28 DIAGNOSIS — E871 Hypo-osmolality and hyponatremia: Secondary | ICD-10-CM

## 2024-08-28 DIAGNOSIS — I255 Ischemic cardiomyopathy: Secondary | ICD-10-CM

## 2024-08-31 ENCOUNTER — Encounter
Admit: 2024-08-31 | Discharge: 2024-08-31 | Payer: MEDICARE | Primary: Student in an Organized Health Care Education/Training Program

## 2024-08-31 NOTE — Progress Notes [1]
 CXR 2 view dated 06/27/2024 sent to PCP on file. Report recommends a follow up CXR 2 view in 6-8 weeks.

## 2024-09-14 ENCOUNTER — Encounter
Admit: 2024-09-14 | Discharge: 2024-09-14 | Payer: MEDICARE | Primary: Student in an Organized Health Care Education/Training Program

## 2024-09-17 ENCOUNTER — Encounter
Admit: 2024-09-17 | Discharge: 2024-09-17 | Payer: MEDICARE | Primary: Student in an Organized Health Care Education/Training Program

## 2024-09-17 MED FILL — ENTRESTO 24-26 MG PO TAB: 24-26 mg | ORAL | 30 days supply | Qty: 60 | Fill #6 | Status: AC

## 2024-09-18 ENCOUNTER — Encounter
Admit: 2024-09-18 | Discharge: 2024-09-18 | Payer: MEDICARE | Primary: Student in an Organized Health Care Education/Training Program

## 2024-09-21 ENCOUNTER — Encounter
Admit: 2024-09-21 | Discharge: 2024-09-21 | Payer: MEDICARE | Primary: Student in an Organized Health Care Education/Training Program

## 2024-09-21 ENCOUNTER — Ambulatory Visit
Admit: 2024-09-21 | Discharge: 2024-09-22 | Payer: MEDICARE | Primary: Student in an Organized Health Care Education/Training Program

## 2024-09-21 ENCOUNTER — Ambulatory Visit
Admit: 2024-09-21 | Discharge: 2024-09-21 | Payer: MEDICARE | Primary: Student in an Organized Health Care Education/Training Program

## 2024-09-21 VITALS — BP 112/62 | HR 62 | Ht 68.0 in | Wt 174.8 lb

## 2024-09-21 DIAGNOSIS — I440 Atrioventricular block, first degree: Secondary | ICD-10-CM

## 2024-09-21 DIAGNOSIS — R002 Palpitations: Secondary | ICD-10-CM

## 2024-09-21 DIAGNOSIS — I10 Essential (primary) hypertension: Secondary | ICD-10-CM

## 2024-09-21 DIAGNOSIS — R0989 Other specified symptoms and signs involving the circulatory and respiratory systems: Secondary | ICD-10-CM

## 2024-09-21 DIAGNOSIS — R001 Bradycardia, unspecified: Principal | ICD-10-CM

## 2024-09-21 DIAGNOSIS — Z950 Presence of cardiac pacemaker: Secondary | ICD-10-CM

## 2024-09-21 NOTE — Patient Instructions [37]
 Please keep your appointment with Dr. Kovelamudi as scheduled and follow up with Dr. Cathlean as needed.   If you would like to call us  to make this appt, please call [phone].  The schedule is released approximately 4-5 months in advance.    My name is Will Darryle, CHARITY FUNDRAISER.    In order to provide you the best care possible we ask that you follow up as below:    Contacting our office:    -Business Hours: Monday-Friday, 8:00 am-4:30 pm (excluding Holidays).     -For medical questions or concerns, please send us  a message through your MyChart account or call the [facility] nursing triage line at [phone]. Please leave a detailed message with your name, date of birth, and reason for your call.  If your message is received before 3:30pm, every effort will be made to call you back the same day.  Please allow time for us  to review your chart prior to call back.     -For medication refills please start by contacting your pharmacy. You can also send us  a prescription question through your MyChart or call the nurse triage line above.     -Should you have an immediate need of the weekend/nights and holidays, please call our on-call triage line at [phone].    -Our fax number is [phone].    Results & Testing Follow Up:    -Please allow 10-15 business days for the results of any testing to be reviewed. Please call our office if you have not heard from a nurse within this time frame.    -Should you choose to complete testing at an outside facility, please contact our office after completion of testing so that we can ensure that we have received results.    Lab and test results:  As a part of the CARES act, starting 01/14/2020, some results will be released to you via mychart immediately and automatically.  You may see results before your provider sees them; however, your provider will review all these results and then they, or one of their team, will notify you of result information and recommendations. Critical results will be addressed immediately, but otherwise, please allow us  time to get back with you prior to you reaching out to us  for questions.  This will usually take about 72 hours for labs and 5-7 days for procedure test results.      -You will receive a survey in the upcoming week from [employer] [facility] [place]  [facility]

## 2024-09-21 NOTE — Progress Notes [1]
 Date of Service: 09/21/2024    Manuel Gould is a 89 y.o. male.       HPI  Manuel Gould was seen in the office today in electrophysiology follow up. He typically follows with Dr. Kovelamudi. 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 He is being seen today for follow up after dual-chamber left bundle branch area pacemaker implant with Dr. Cathlean on 06/26/2024.     Regarding his history, he was recently hospitalized at [facility] from 9/11 to 06/27/2024 for progressive dizziness with exertion shortly after he saw Dr. Delma in the office. Metoprolol  dose had been decreased at his office visit and 50 mg to 25 mg daily and he was sent home with a Zio patch. However he subsequently was admitted on 9/11 due to worsening symptomatic bradycardia with heart rate in 30s. On admission he was bradycardic in the mid 30s with EKG showing 2: 1 AV block with RBBB. Echo 9/12 showed mild reduced LV systolic function with EF 45%, hypokinesis/akinesis of the inferior and inferolateral wall segments. Mildly dilated LV with LVIDD 6.6 cm. Mild biatrial dilatation. Normal CVP. Elevated PASP 58 mmHg. mitral annular dilatation and leaflet thickening with mild MR. Mild to moderate TR. Mild aortic insufficiency. No pericardial effusion. EP consulted and he underwent dual-chamber right sided (LBBa) PPM placement on 06/26/2024. Postprocedure he reported improvement in symptoms.     He presents today for 3 months post implant follow-up accompanied by his wife.  He reports he overall is doing well.  They report his symptoms have likely improved since the pacemaker.  He denies any palpitations, fluttering or skipped beats.  He denies any angina or DOE.  He is able to do all his daily activities without any significant limitations.  He does get occasional positional lightheadedness but denies any near-syncope or syncope.  Blood pressure looks good today.  He otherwise denies any acute cardiovascular complaints today.       Objective   Vitals:    09/21/24 1450   BP: 112/62   BP Source: Arm, Left Upper   Pulse: 62   O2 Device: None (Room air)   PainSc: Zero   Weight: 79.3 kg (174 lb 12.8 oz)   Height: 172.7 cm (5' 8)     Body mass index is 26.58 kg/m?[name]     Past Medical History  Patient Active Problem List    Diagnosis Date Noted    Presence of permanent cardiac pacemaker 08/03/2024    Cellulitis of left lower extremity 08/03/2024    Bradycardia 06/25/2024    Hyponatremia 03/06/2024    Ischemic cardiomyopathy 02/09/2024    Hyponatremia 02/09/2024    Elevated d-dimer 06/21/2021    Closed fracture of distal end of right fibula with routine healing 06/21/2021    HFrEF (heart failure with reduced ejection fraction) (CMS-HCC) 06/21/2021    First degree AV block 06/21/2021    Dyslipidemia 06/21/2021    Statin intolerance 06/21/2021    Pulmonary nodules 06/21/2021    Normocytic anemia 06/21/2021  B12 deficiency 06/21/2021    Pre-diabetes 06/21/2021    History of basal cell carcinoma 01/23/2019    BPH (benign prostatic hyperplasia) 01/23/2019    COPD (chronic obstructive pulmonary disease) (CMS-HCC)     Ventricular hypertrophy 02/23/2009    History of MI (myocardial infarction) 02/23/2009    Asthma 02/23/2009    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 June 2003 with LIMA graft to the LAD, a radial artery to the circumflex and a vein graft to the PDA.    B. Previous heart cath at [facility] with unsuccessful attempts to revascularize the artery, but there was no evidence of ischemia on perfusion exam in the right coronary artery territory.    C. Stress study from May 2005 showed an inferolateral perfusion defect that was not present on the more recent study      here in February 2010.    D. Stress study from February 2010 which was performed with regadenoson  stress with an ejection fraction of 62 percent had normal left ventricular volumes.  The perfusion images did not show ischemia.  There was evidence of some LAD injury, but no high risk indicators were noted.  E. 11/21/15 LHC: Patent grafts.      Essential hypertension        Review of Systems   Constitutional: Negative.   HENT: Negative.     Eyes: Negative.    Cardiovascular: Negative.    Respiratory: Negative.     Endocrine: Negative.    Hematologic/Lymphatic: Negative.    Skin: Negative.    Musculoskeletal: Negative.    Gastrointestinal: Negative.    Genitourinary: Negative.    Neurological: Negative.    Psychiatric/Behavioral: Negative.     Allergic/Immunologic: Negative.    All other systems reviewed and are negative.      Physical Exam   HENT:   Head: Normocephalic and atraumatic. Mouth/Throat: Mucous membranes are moist.   Cardiovascular: Normal rate and regular rhythm.   Pulmonary/Chest: Effort normal and breath sounds normal.   Musculoskeletal:         General: Normal range of motion.      Cervical back: Normal range of motion.      Right lower leg: No edema.      Left lower leg: No edema.   Neurological: He is alert.   Skin: Skin is warm and dry.   right chest incision c/d/i   Psychiatric: His behavior is normal. Mood normal.       Cardiovascular Studies  Premilinary ECG obtained in clinic today shows atrial sensed ventricular paced rhythm at 62 bpm with PR interval 158 ms.  QRS durations 132 ms.  RSR prime in V1.    Device interrogation obtained in clinic today shows normal functioning Medtronic dual-chamber pacemaker device with 12.17 years estimated battery life remaining.  Sensing, impedance and thresholds tested and are stable.  Underlying rhythm was complete heart block.  No events/alerts.  AP 44%.  VP 99.9%.      Cardiovascular Health Factors  Vitals BP Readings from Last 3 Encounters:   09/21/24 112/62   08/03/24 118/68   06/27/24 136/66     Wt Readings from Last 3 Encounters:   09/21/24 79.3 kg (174 lb 12.8 oz)   08/03/24 80.8 kg (178 lb 3.2 oz)   06/27/24 80.4 kg (177 lb 4 oz)  BMI Readings from Last 3 Encounters:   09/21/24 26.58 kg/m?   08/03/24 27.10 kg/m?   06/27/24 26.95 kg/m?      Smoking Tobacco Use History[1]   Lipid Profile Cholesterol   Date Value Ref Range Status   06/26/2024 124 <200 mg/dL Final     HDL   Date Value Ref Range Status   06/26/2024 44 >40 mg/dL Final     LDL   Date Value Ref Range Status   06/26/2024 69 <100 mg/dL Final     Triglycerides   Date Value Ref Range Status   06/26/2024 69 <150 mg/dL Final      Blood Sugar Hemoglobin A1C   Date Value Ref Range Status   06/26/2024 5.5 4.0 - 5.7 % Final     Comment:     The ADA recommends that most patients with type 1 and type 2 diabetes maintain an A1c level <7%.     Glucose   Date Value Ref Range Status   08/26/2024 115 (H) 74 - 100 Final   08/10/2024 91  Final   06/27/2024 103 (H) 70 - 100 mg/dL Final         Problems Addressed Today  Encounter Diagnoses   Name Primary?    Bradycardia Yes    Palpitations     First degree AV block     Cardiovascular symptoms     Essential hypertension     Presence of permanent cardiac pacemaker      Impression:  # Sinus bradycardia s/p DC LBBA PPM 06/26/2024   # Mild ischemic cardiomyopathy  - EF 40-45% 01/2024  - EF 45% 06/26/2024     # CAD   - prior CABG (2002)  - NSTEMI (2020)     # RBBB  # 1st degree AV block  # Prior CVa  # HTN  # HLD  # COPD  # Hx spontaneous pneumo requiring chest tube 09/2023        Assessment and Plan  Manuel Gould presents for routine 38-month post dual-chamber pacemaker implantation follow-up.  He has since recovered from the procedure and his incision has healed without any signs or symptoms of infection.  He reports improvement in his symptoms since the implant.  He denies any palpitations, fluttering or skipped beats.  Device interrogation today shows a normal functioning device without any significant arrhythmias.  Underlying rhythm is complete heart block and he continues to ventricular paced 99.9% of the time from his left bundle branch area lead.  Therefore he will plan to continue his current medical regimen and follow-up with EP on an as-needed basis.  He would like to continue his more routine follow-ups closer to home with Dr. Kovelamudi in which she can continue to get annual pacemaker checks there.  We will continue to follow him remotely via his device.  In the meantime he is instructed to call the clinic for any worsening symptoms.    Thank you for allowing me to participate in the care of this pleasant patient. Please reach out with additional questions or concerns.     Clive Chancy, PA-C  Heart Rhythm Management   Department of Cardiovasular Medicine      Total Time Today was 45 minutes in the following activities: Preparing to see the patient, Obtaining and/or reviewing separately obtained history, Performing a medically appropriate examination and/or evaluation, Counseling and educating the patient/family/caregiver, Ordering medications, tests, or procedures, Referring and communication with other health care professionals (when not separately reported), Documenting  clinical information in the electronic or other health record, Independently interpreting results (not separately reported) and communicating results to the patient/family/caregiver, and Care coordination (not separately reported)       Current Medications (including today's revisions)   Aspirin  81 mg Tab Take one tablet by mouth daily.    budesonide  respule (PULMICORT ) 0.5 mg/2 mL nebulizer solution Inhale 2 mL solution by nebulizer as directed twice daily.    cephalexin  (KEFLEX ) 500 mg capsule Take one capsule by mouth four times daily. Take for 7 full days starting today 08/03/24. (Patient not taking: Reported on 09/21/2024)    finasteride  (PROSCAR ) 5 mg tablet Take one tablet by mouth daily.    furosemide  (LASIX ) 20 mg tablet Take one tablet by mouth every morning.    isosorbide  mononitrate ER (IMDUR ) 60 mg ER tablet Take one tablet by mouth every morning.    metoprolol  succinate XL (TOPROL  XL) 25 mg extended release tablet Take one tablet by mouth daily.    nitroglycerin  (NITROSTAT ) 0.4 mg tablet Place one tablet under tongue every 5 minutes as needed for Chest Pain. Max of 3 tablets, call 911.    pantoprazole  DR (PROTONIX ) 40 mg tablet Take one tablet by mouth daily.    sacubitriL -valsartan  (ENTRESTO ) 24-26 mg tablet Take one tablet by mouth twice daily.    spironolactone (ALDACTONE) 25 mg tablet Take one tablet by mouth daily. Take with food.                 [1]   Social History  Tobacco Use   Smoking Status Former    Current packs/day: 0.00    Types: Cigarettes    Quit date: 10/15/1958    Years since quitting: 65.9    Passive exposure: Past   Smokeless Tobacco Never

## 2024-09-24 ENCOUNTER — Encounter
Admit: 2024-09-24 | Discharge: 2024-09-24 | Payer: MEDICARE | Primary: Student in an Organized Health Care Education/Training Program

## 2024-09-24 VITALS — BP 124/65 | HR 79 | Ht 68.0 in | Wt 173.4 lb

## 2024-09-24 DIAGNOSIS — Z136 Encounter for screening for cardiovascular disorders: Principal | ICD-10-CM

## 2024-09-24 DIAGNOSIS — R002 Palpitations: Secondary | ICD-10-CM

## 2024-09-24 DIAGNOSIS — I517 Cardiomegaly: Secondary | ICD-10-CM

## 2024-09-24 DIAGNOSIS — I5020 Unspecified systolic (congestive) heart failure: Secondary | ICD-10-CM

## 2024-09-24 DIAGNOSIS — R42 Dizziness and giddiness: Secondary | ICD-10-CM

## 2024-09-24 DIAGNOSIS — E785 Hyperlipidemia, unspecified: Secondary | ICD-10-CM

## 2024-09-24 DIAGNOSIS — I255 Ischemic cardiomyopathy: Secondary | ICD-10-CM

## 2024-09-24 DIAGNOSIS — I10 Essential (primary) hypertension: Secondary | ICD-10-CM

## 2024-09-24 DIAGNOSIS — E871 Hypo-osmolality and hyponatremia: Secondary | ICD-10-CM

## 2024-09-24 NOTE — Progress Notes [1]
 Cardiovascular Medicine       Date of Service: 09/24/2024      HPI     Manuel Gould is a 89 y.o. male who was seen today in the Cardiovascular Medicine Clinic at [facility] at our [place] office.        His lovely wife, Niels, is also a patient of mine. She also presents for follow up.      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 He was hospitalized at [facility] in December 2024 with spontaneous right sided pneumothorax requiring chest tube placement.     He was recently hospitalized at [facility] from 9/11 to 06/27/2024 for progressive dizziness with exertion shortly after he saw me in office.  At the previous office visit, I had decreased his metoprolol  and sent him home on a Zio patch because of complaints of dizziness. However he subsequently was admitted on 9/11 due to worsening symptomatic bradycardia with heart rate in 30s. On admission he was bradycardic in the mid 30s with EKG showing 2: 1 AV block with RBBB. Echo 9/12 showed mild reduced LV systolic function with EF 45%, hypokinesis/akinesis of the inferior and inferolateral wall segments. Mildly dilated LV with LVIDD 6.6 cm. Mild biatrial dilatation. Normal CVP. Elevated PASP 58 mmHg. mitral annular dilatation and leaflet thickening with mild MR. Mild to moderate TR. Mild aortic insufficiency. No pericardial effusion. EP consulted and he underwent dual-chamber right sided (LBBa) PPM placement on 06/26/2024.       He presents today for follow-up.  Since pacemaker placement, he reports significant improvement in exercise tolerance and decrease in his dizziness spells.  He is able to do some yard work and is more active than he previously was.          Transthoracic echocardiogram: 06/2024   Profound sinus bradycardia throughout exam with first-degree AV block with heart rates in low 30s bpm     Left ventricular chamber is moderately dilated with LVIDD of 6.6 cm and LVEDVi of 95 mL/m? with normal wall thickness.    Hypokinesis/akinesis of the inferior and inferolateral wall segments as depicted in the diagram below.  Mildly reduced LV systolic function.  LVEF=45%.  Normal right ventricular size with low-normal global right ventricular contractility.  TAPSE = 18 mm.  Mild biatrial dilatation. Normal central venous pressure (0-5 mmHg).  Estimated pulmonary artery systolic pressure is 58 mmHg.  Mitral annular dilatation and leaflet thickening.  Mild MR.  Mild to moderate TR.  Focally thickened aortic leaflets without stenosis.  Mild AI.  Aortic root and proximal ascending aorta are normal in size.  No pericardial effusion.     Compared with prior study dated 01/24/2024, global LV and regional LV systolic function appear grossly unchanged however profound bradycardia is a new finding on today's study.  There is some interval increase in Severity of the tricuspid regurgitation is well with evidence of elevated pulmonary artery systolic pressure on today's study.  Assessment & Plan   89 y.o. male patient with the following medical problems:     Coronary artery disease s/p coronary artery bypass graft surgery.  History of ischemic cardiomyopathy.  High-grade AV block status post LBB pacemaker placement.  Hypertension.  Hyperlipidemia.  History of basal cell carcinoma.  COPD.  History of emphysema.  Recent right-sided pneumothorax.     Doing well after device placement.  Dizziness has significantly improved.  Continue guideline directed medical therapy of coronary artery disease with aspirin , isosorbide  mononitrate.    GDMT for ischemic cardiomyopathy include spironolactone, Entresto  and metoprolol .  Currently euvolemic on Lasix  20 mg daily, will continue.  Continue to monitor lipid panel annually and adjust lipid-lowering therapy as indicated.        Return to clinic in 6 months.       Past Medical History  Patient Active Problem List    Diagnosis Date Noted    Presence of permanent cardiac pacemaker 08/03/2024    Cellulitis of left lower extremity 08/03/2024    Bradycardia 06/25/2024    Hyponatremia 03/06/2024    Ischemic cardiomyopathy 02/09/2024    Hyponatremia 02/09/2024    Elevated d-dimer 06/21/2021    Closed fracture of distal end of right fibula with routine healing 06/21/2021    HFrEF (heart failure with reduced ejection fraction) (CMS-HCC) 06/21/2021    First degree AV block 06/21/2021    Dyslipidemia 06/21/2021    Statin intolerance 06/21/2021    Pulmonary nodules 06/21/2021    Normocytic anemia 06/21/2021    B12 deficiency 06/21/2021    Pre-diabetes 06/21/2021    History of basal cell carcinoma 01/23/2019    BPH (benign prostatic hyperplasia) 01/23/2019    COPD (chronic obstructive pulmonary disease) (CMS-HCC)     Ventricular hypertrophy 02/23/2009    History of MI (myocardial infarction) 02/23/2009    Asthma 02/23/2009    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 June 2003 with LIMA graft to the LAD, a radial artery to the circumflex and a vein graft to the PDA.    B. Previous heart cath at [facility] with unsuccessful attempts to revascularize the artery, but there was no evidence of ischemia on perfusion exam in the right coronary artery territory.    C. Stress study from May 2005 showed an inferolateral perfusion defect that was not present on the more recent study      here in February 2010.    D. Stress study from February 2010 which was performed with regadenoson  stress with an ejection fraction of 62 percent had normal left ventricular volumes.  The perfusion images did not show ischemia.  There was evidence of some LAD injury, but no high risk indicators were noted.  E. 11/21/15 LHC: Patent grafts.      Essential hypertension        I reviewed and confirmed this patient's problem list, active medications, allergies, and past medical, social, family & tobacco histories.     Review of Systems  Review of Systems   Constitutional: Negative.   HENT: Negative.     Eyes: Negative.    Cardiovascular: Negative.    Respiratory: Negative.     Endocrine: Negative.    Hematologic/Lymphatic: Negative.    Skin: Negative.    Musculoskeletal: Negative.    Gastrointestinal: Negative.    Genitourinary: Negative.    Neurological:  Negative.    Psychiatric/Behavioral: Negative.     Allergic/Immunologic: Negative.      14 point review of systems negative except as above.    Vitals:    09/24/24 0922   BP: 124/65   BP Source: Arm, Right Upper   Pulse: 79   SpO2: 95%   O2 Device: None (Room air)   PainSc: Zero   Weight: 78.7 kg (173 lb 6.4 oz)   Height: 172.7 cm (5' 8)     Body mass index is 26.37 kg/m?[name]     Physical Exam  General Appearance: no acute distress  HEENT: EOMI, mucous membranes moist, oropharynx is clear  Neck Veins: neck veins are flat & not distended  Carotid Arteries: no bruits  Chest Inspection: chest is normal in appearance  Auscultation/Percussion: lungs clear to auscultation, no rales, rhonchi, or wheezing  Cardiac Rhythm: regular rhythm & normal rate  Cardiac Auscultation: Normal S1 & S2, no S3 or S4, no rub  Murmurs: no cardiac murmurs  Abdominal Exam: soft, non-tender, normal bowel sounds, no masses or bruits  Abdominal aorta: nonpalpable   Liver & Spleen: no organomegaly  Extremities: no lower extremity edema; palpable distal pulses  Skin: warm & intact  Neurologic Exam: oriented to time, place and person; no focal neurologic deficits       Cardiovascular Studies  06/25/24   2D + DOPPLER ECHO   Result Value Ref Range    BSA 1.95 m2    Cardiology Ultrasound Machine GE Viq     LVIDD 6.6 4.2 - 5.8 cm    LVIDS 3.9 2.5 - 4 cm    IVS 0.8 0.6 - 1 cm    PW 0.7 0.6 - 1 cm    FS 40.91 28 - 44 %    Teichholtz 69.55 %    LA size 4.3 3 - 4 cm    Left Ventricle Systolic Volume 64 21 - 61 mL    Left Ventricle Systolic Volume Index 33 11 - 31 mL/m2    Left Ventricle Diastolic Volume 185 62 - 150 mL    Left Ventricle Diastolic Volume Index 95 34 - 74 mL/m2    LA volume 75 18 - 58 mL    Left Atrium Index 38.46 16 - 34 mL/m2    LV mass 204 88 - 224 g    Left Ventricle Mass Index 104 49 - 115 g/m2    RWT 0.21 <=0.42    Right Ventricular Basal Diameter 3.8 2.5 - 4.1 cm    Right Atrial Area 20.7 <18 cm2    Right Ventricular Mid Diameter 3.1 1.9 - 3.5 cm    Right Heart Systolic Mmode TAPSE 1.8 >1.7 cm    Sinus 3.4 2.8 - 4 cm    Ascending aorta 3.3 cm    LVOT diameter 2.1 cm    LVOT area 3.46 cm2    LVOT peak vel 0.7 m/s    LVOT peak VTI 19.5 cm    LVOT stroke volume 67.54 cm3    AV Stroke Volume Index 35     AV peak velocity 1.6 m/s    AV index (native) 0.44     AV regurgitation pressure 1/2 time 803 msec    AV vena contracta 0.30 cm    MV vena contracta 0.30 cm    Vn Nyquist 0.30 m/s    Radius 0.4 cm    TR PEAK VELOCITY 3.7 m/s    RV SYSTOLIC PRESSURE 55  Right Heart Systolic TDI S' 0.1 m/s    ECHO EF 45 %    RA PRESSURE 3     TV rest pulmonary artery pressure 58 mmHg        Cardiovascular Health Factors  Vitals BP Readings from Last 3 Encounters:   09/24/24 124/65   09/21/24 112/62   08/03/24 118/68     Wt Readings from Last 3 Encounters:   09/24/24 78.7 kg (173 lb 6.4 oz)   09/21/24 79.3 kg (174 lb 12.8 oz)   08/03/24 80.8 kg (178 lb 3.2 oz)     BMI Readings from Last 3 Encounters:   09/24/24 26.37 kg/m?   09/21/24 26.58 kg/m?   08/03/24 27.10 kg/m?      Smoking Tobacco Use History[1]   Lipid Profile Cholesterol   Date Value Ref Range Status   06/26/2024 124 <200 mg/dL Final     HDL   Date Value Ref Range Status   06/26/2024 44 >40 mg/dL Final     LDL   Date Value Ref Range Status   06/26/2024 69 <100 mg/dL Final     Triglycerides   Date Value Ref Range Status   06/26/2024 69 <150 mg/dL Final      Blood Sugar Hemoglobin A1C   Date Value Ref Range Status   06/26/2024 5.5 4.0 - 5.7 % Final     Comment:     The ADA recommends that most patients with type 1 and type 2 diabetes maintain an A1c level <7%.     Glucose   Date Value Ref Range Status   08/26/2024 115 (H) 74 - 100 Final   08/10/2024 91  Final   06/27/2024 103 (H) 70 - 100 mg/dL Final        ASCVD Risk Assessment:     ASCVD 10-year risk calculated: The ASCVD Risk score (Arnett DK, et al., 2019) failed to calculate for the following reasons:    The 2019 ASCVD risk score is only valid for ages 58 to 4    Risk score cannot be calculated because patient has a medical history suggesting prior/existing ASCVD    * - Cholesterol units were assumed     LDL 70-189, if ASCVD 10-y risk is >7.5%, high to moderate-intensity statin therapy is recommended  Diabetes with ASCVD 10-y risk >7.5%, high-intensity statin therapy is recommended.  Diabetes with ASCVD 10-y risk <7.5%, moderate-intensity statin therapy is recommended.      Current Medications (including today's revisions)   Aspirin  81 mg Tab Take one tablet by mouth daily.    budesonide  respule (PULMICORT ) 0.5 mg/2 mL nebulizer solution Inhale 2 mL solution by nebulizer as directed twice daily.    cephalexin  (KEFLEX ) 500 mg capsule Take one capsule by mouth four times daily. Take for 7 full days starting today 08/03/24. (Patient not taking: Reported on 09/21/2024)    finasteride  (PROSCAR ) 5 mg tablet Take one tablet by mouth daily.    furosemide  (LASIX ) 20 mg tablet Take one tablet by mouth every morning.    isosorbide  mononitrate ER (IMDUR ) 60 mg ER tablet Take one tablet by mouth every morning.    metoprolol  succinate XL (TOPROL  XL) 25 mg extended release tablet Take one tablet by mouth daily.    nitroglycerin  (NITROSTAT ) 0.4 mg tablet Place one tablet under tongue every 5 minutes as needed for Chest Pain. Max of 3 tablets, call 911.    pantoprazole  DR (PROTONIX ) 40 mg tablet Take one tablet by mouth daily.  sacubitriL -valsartan  (ENTRESTO ) 24-26 mg tablet Take one tablet by mouth twice daily.    spironolactone (ALDACTONE) 25 mg tablet Take one tablet by mouth daily. Take with food.         Rosamond Boettcher MD  Cardiovascular Medicine.         [1]   Social History  Tobacco Use   Smoking Status Former    Current packs/day: 0.00    Types: Cigarettes    Quit date: 10/15/1958    Years since quitting: 65.9    Passive exposure: Past   Smokeless Tobacco Never

## 2024-09-24 NOTE — Patient Instructions [37]
 Follow up in 6 months  Remote device every 3 months in office yearly  Follow up as directed.  Call sooner if issues.  Call the [place] nursing line at [phone].  Leave a detailed message for the nurse in [place] with how we can assist you and we will call you back.

## 2024-09-29 ENCOUNTER — Encounter
Admit: 2024-09-29 | Discharge: 2024-09-29 | Payer: MEDICARE | Primary: Student in an Organized Health Care Education/Training Program

## 2024-10-12 ENCOUNTER — Encounter
Admit: 2024-10-12 | Discharge: 2024-10-12 | Payer: MEDICARE | Primary: Student in an Organized Health Care Education/Training Program

## 2024-10-13 ENCOUNTER — Encounter
Admit: 2024-10-13 | Discharge: 2024-10-13 | Payer: MEDICARE | Primary: Student in an Organized Health Care Education/Training Program

## 2024-10-13 MED FILL — ENTRESTO 24-26 MG PO TAB: 24-26 mg | ORAL | 30 days supply | Qty: 60 | Fill #7 | Status: AC

## 2024-11-09 ENCOUNTER — Encounter
Admit: 2024-11-09 | Discharge: 2024-11-09 | Payer: MEDICARE | Primary: Student in an Organized Health Care Education/Training Program

## 2024-11-10 ENCOUNTER — Encounter
Admit: 2024-11-10 | Discharge: 2024-11-10 | Payer: MEDICARE | Primary: Student in an Organized Health Care Education/Training Program

## 2024-11-10 MED FILL — ENTRESTO 24-26 MG PO TAB: 24-26 mg | ORAL | 30 days supply | Qty: 60 | Fill #8 | Status: AC
